# Patient Record
Sex: Male | Born: 1960 | Race: Black or African American | Hispanic: No | Marital: Single
Health system: Southern US, Community
[De-identification: ages and names within clinical notes are randomized; demographics above are authoritative.]

## PROBLEM LIST (undated history)

## (undated) DIAGNOSIS — N186 End stage renal disease: Secondary | ICD-10-CM

## (undated) DIAGNOSIS — K5790 Diverticulosis of intestine, part unspecified, without perforation or abscess without bleeding: Secondary | ICD-10-CM

## (undated) DIAGNOSIS — K219 Gastro-esophageal reflux disease without esophagitis: Secondary | ICD-10-CM

## (undated) DIAGNOSIS — E119 Type 2 diabetes mellitus without complications: Secondary | ICD-10-CM

## (undated) DIAGNOSIS — E213 Hyperparathyroidism, unspecified: Secondary | ICD-10-CM

## (undated) DIAGNOSIS — I10 Essential (primary) hypertension: Secondary | ICD-10-CM

## (undated) DIAGNOSIS — I4892 Unspecified atrial flutter: Secondary | ICD-10-CM

## (undated) HISTORY — PX: TRACHEOSTOMY: SUR1362

## (undated) HISTORY — PX: PARATHYROIDECTOMY: SHX19

## (undated) HISTORY — PX: THYROIDECTOMY, PARTIAL: SHX18

## (undated) HISTORY — PX: COLECTOMY: SHX59

---

## 2014-10-14 ENCOUNTER — Institutional Professional Consult (permissible substitution) (HOSPITAL_COMMUNITY): Payer: Self-pay

## 2014-10-14 ENCOUNTER — Inpatient Hospital Stay
Admission: AD | Admit: 2014-10-14 | Discharge: 2014-12-02 | Disposition: A | Discharge status: Skilled Nursing Facility | Payer: Self-pay | Source: Ambulatory Visit | Attending: Internal Medicine | Admitting: Internal Medicine

## 2014-10-14 DIAGNOSIS — K810 Acute cholecystitis: Secondary | ICD-10-CM | POA: Insufficient documentation

## 2014-10-14 DIAGNOSIS — J9600 Acute respiratory failure, unspecified whether with hypoxia or hypercapnia: Secondary | ICD-10-CM

## 2014-10-14 DIAGNOSIS — I10 Essential (primary) hypertension: Secondary | ICD-10-CM | POA: Diagnosis present

## 2014-10-14 DIAGNOSIS — Z13810 Encounter for screening for upper gastrointestinal disorder: Secondary | ICD-10-CM

## 2014-10-14 DIAGNOSIS — K819 Cholecystitis, unspecified: Secondary | ICD-10-CM

## 2014-10-14 DIAGNOSIS — R140 Abdominal distension (gaseous): Secondary | ICD-10-CM | POA: Insufficient documentation

## 2014-10-14 DIAGNOSIS — E213 Hyperparathyroidism, unspecified: Secondary | ICD-10-CM

## 2014-10-14 DIAGNOSIS — Z930 Tracheostomy status: Secondary | ICD-10-CM

## 2014-10-14 DIAGNOSIS — K7460 Unspecified cirrhosis of liver: Secondary | ICD-10-CM

## 2014-10-14 DIAGNOSIS — N186 End stage renal disease: Secondary | ICD-10-CM

## 2014-10-14 DIAGNOSIS — L905 Scar conditions and fibrosis of skin: Secondary | ICD-10-CM | POA: Diagnosis present

## 2014-10-14 DIAGNOSIS — D649 Anemia, unspecified: Secondary | ICD-10-CM | POA: Diagnosis present

## 2014-10-14 DIAGNOSIS — Z8719 Personal history of other diseases of the digestive system: Secondary | ICD-10-CM

## 2014-10-14 DIAGNOSIS — L24A9 Irritant contact dermatitis due friction or contact with other specified body fluids: Secondary | ICD-10-CM

## 2014-10-14 DIAGNOSIS — Z992 Dependence on renal dialysis: Secondary | ICD-10-CM

## 2014-10-14 DIAGNOSIS — E1110 Type 2 diabetes mellitus with ketoacidosis without coma: Secondary | ICD-10-CM | POA: Diagnosis present

## 2014-10-14 DIAGNOSIS — K5790 Diverticulosis of intestine, part unspecified, without perforation or abscess without bleeding: Secondary | ICD-10-CM | POA: Diagnosis present

## 2014-10-14 DIAGNOSIS — J90 Pleural effusion, not elsewhere classified: Secondary | ICD-10-CM

## 2014-10-14 DIAGNOSIS — T148XXA Other injury of unspecified body region, initial encounter: Secondary | ICD-10-CM

## 2014-10-14 DIAGNOSIS — R451 Restlessness and agitation: Secondary | ICD-10-CM | POA: Diagnosis present

## 2014-10-14 DIAGNOSIS — K219 Gastro-esophageal reflux disease without esophagitis: Secondary | ICD-10-CM | POA: Diagnosis present

## 2014-10-14 DIAGNOSIS — I4892 Unspecified atrial flutter: Secondary | ICD-10-CM

## 2014-10-14 DIAGNOSIS — Z9889 Other specified postprocedural states: Secondary | ICD-10-CM

## 2014-10-14 DIAGNOSIS — J9690 Respiratory failure, unspecified, unspecified whether with hypoxia or hypercapnia: Secondary | ICD-10-CM

## 2014-10-14 DIAGNOSIS — Z720 Tobacco use: Secondary | ICD-10-CM

## 2014-10-14 DIAGNOSIS — K567 Ileus, unspecified: Secondary | ICD-10-CM

## 2014-10-14 DIAGNOSIS — Z4659 Encounter for fitting and adjustment of other gastrointestinal appliance and device: Secondary | ICD-10-CM | POA: Insufficient documentation

## 2014-10-14 DIAGNOSIS — R0603 Acute respiratory distress: Secondary | ICD-10-CM

## 2014-10-14 DIAGNOSIS — R4182 Altered mental status, unspecified: Secondary | ICD-10-CM | POA: Insufficient documentation

## 2014-10-14 DIAGNOSIS — K8020 Calculus of gallbladder without cholecystitis without obstruction: Secondary | ICD-10-CM

## 2014-10-14 HISTORY — DX: End stage renal disease: N18.6

## 2014-10-14 HISTORY — DX: Hyperparathyroidism, unspecified: E21.3

## 2014-10-14 HISTORY — DX: Essential (primary) hypertension: I10

## 2014-10-14 HISTORY — DX: Unspecified atrial flutter: I48.92

## 2014-10-14 HISTORY — DX: Gastro-esophageal reflux disease without esophagitis: K21.9

## 2014-10-14 HISTORY — DX: Type 2 diabetes mellitus without complications: E11.9

## 2014-10-14 HISTORY — DX: Diverticulosis of intestine, part unspecified, without perforation or abscess without bleeding: K57.90

## 2014-10-14 LAB — BLOOD GAS, ARTERIAL
ACID-BASE EXCESS: 3.1 mmol/L — AB (ref 0.0–2.0)
BICARBONATE: 27.7 meq/L — AB (ref 20.0–24.0)
FIO2: 0.4 %
O2 Saturation: 97.6 %
PCO2 ART: 47.2 mmHg — AB (ref 35.0–45.0)
PEEP/CPAP: 5 cmH2O
PH ART: 7.386 (ref 7.350–7.450)
PO2 ART: 98.9 mmHg (ref 80.0–100.0)
Patient temperature: 98.6
Pressure support: 14 cmH2O
TCO2: 29.1 mmol/L (ref 0–100)

## 2014-10-14 NOTE — Consult Note (Signed)
Date: 10/14/2014                  Patient Name:  Harold Mayo  MRN: [id number]  DOB: [birth year]  Age / Sex: 54 y.o., male         PCP: No primary care provider on file.                 Service Requesting Consult: Internal medicine                 Reason for Consult: ESRD            History of Present Illness: Patient is a 54 y.o. male with medical problems of ESRD, who was [facility] 10[facility]14[facility]2014 for evaluation of respiratory failure. He underwent Ex Lap with sigmoid resection and colostomy on 09/10/14. He now has a wound vac. He is not able to provide any meaningful history due to vent dependency and sedation. All information is obtained from the chart. Patient is a chronic dialysis patient. Dialyzes at one of the [facility] centers in [place], [place]. Transferred from [facility]   Medications: Outpatient medications: No prescriptions prior to admission    Current medications: No current facility-administered medications for this encounter.      Allergies: Allergies not on file    Past Medical History: No past medical history on file.   Past Surgical History: No past surgical history on file.   Family History: No family history on file.   Social History: History   Social History  . Marital Status: Single    Spouse Name: N/A  . Number of Children: N/A  . Years of Education: N/A   Occupational History  . Not on file.   Social History Main Topics  . Smoking status: Not on file  . Smokeless tobacco: Not on file  . Alcohol Use: Not on file  . Drug Use: Not on file  . Sexual Activity: Not on file   Other Topics Concern  . Not on file   Social History Narrative  . No narrative on file     Review of Systems: N/A  Vital Signs: There were no vitals taken for this visit.  98  21  94  112/71 Weight trends: There were no vitals filed for this visit.  Physical Exam: General:  critically ill appearing  HEENT Muddy  sclera, NGT in place  Neck:  trach in place  Lungs: Vent assisted, Fio2 40%  Heart: Regular, no rub  Abdomen: Wound vac in place  Extremities:  no significant peripheral edema  Neurologic: sedated  Skin: Left forearm AVF             Lab results: Basic Metabolic Panel: No results for input(s): NA, K, CL, CO2, GLUCOSE, BUN, CREATININE, CALCIUM, MG, PHOS in the last 168 hours.  Liver Function Tests: No results for input(s): AST, ALT, ALKPHOS, BILITOT, PROT, ALBUMIN in the last 168 hours. No results for input(s): LIPASE, AMYLASE in the last 168 hours. No results for input(s): AMMONIA in the last 168 hours.  CBC: No results for input(s): WBC, NEUTROABS, HGB, HCT, MCV, PLT in the last 168 hours.  Cardiac Enzymes: No results for input(s): CKTOTAL, TROPONINI in the last 168 hours.  BNP: Invalid input(s): POCBNP  CBG: No results for input(s): GLUCAP in the last 168 hours.  Microbiology: No results found for this or any previous visit.  Coagulation Studies: No results for input(s): LABPROT, INR in the  last 72 hours.  Urinalysis: No results for input(s): COLORURINE, LABSPEC, PHURINE, GLUCOSEU, HGBUR, BILIRUBINUR, KETONESUR, PROTEINUR, UROBILINOGEN, NITRITE, LEUKOCYTESUR in the last 72 hours.  Invalid input(s): APPERANCEUR   Labs from d/c summary were reviewed K 3.9 on 10/14/14  Imaging:  No results found.   Assessment & Plan: Pt is a [age] y.o. yo male with a PMHX of ESRD, HTN, CHF, Previous alcohol abuse, tobacco abse, , [facility] with resp failure.  He is s/p Ex Lap, sigmoid colectomy and colostomy placement. He has a wound vac  1. ESRD - Electrolytes and Volume status are acceptable No acute indication for Dialysis at present  - WIll place orders for dialysis for Friday 2. AOCKD - last Hgb 8.7 today - will start Aranesp 3. SHPTH - will monitor Phos level 4. Screen Hep B Ag

## 2014-10-15 ENCOUNTER — Other Ambulatory Visit (HOSPITAL_COMMUNITY): Payer: Self-pay

## 2014-10-15 ENCOUNTER — Encounter: Payer: Self-pay | Admitting: Pulmonary Disease

## 2014-10-15 DIAGNOSIS — Z930 Tracheostomy status: Secondary | ICD-10-CM

## 2014-10-15 DIAGNOSIS — Z992 Dependence on renal dialysis: Secondary | ICD-10-CM

## 2014-10-15 DIAGNOSIS — N186 End stage renal disease: Secondary | ICD-10-CM

## 2014-10-15 DIAGNOSIS — I10 Essential (primary) hypertension: Secondary | ICD-10-CM

## 2014-10-15 DIAGNOSIS — Z720 Tobacco use: Secondary | ICD-10-CM

## 2014-10-15 DIAGNOSIS — E213 Hyperparathyroidism, unspecified: Secondary | ICD-10-CM

## 2014-10-15 DIAGNOSIS — D649 Anemia, unspecified: Secondary | ICD-10-CM | POA: Diagnosis present

## 2014-10-15 DIAGNOSIS — J9600 Acute respiratory failure, unspecified whether with hypoxia or hypercapnia: Secondary | ICD-10-CM

## 2014-10-15 DIAGNOSIS — R451 Restlessness and agitation: Secondary | ICD-10-CM

## 2014-10-15 DIAGNOSIS — K5790 Diverticulosis of intestine, part unspecified, without perforation or abscess without bleeding: Secondary | ICD-10-CM | POA: Diagnosis present

## 2014-10-15 DIAGNOSIS — Z9889 Other specified postprocedural states: Secondary | ICD-10-CM

## 2014-10-15 DIAGNOSIS — J9601 Acute respiratory failure with hypoxia: Secondary | ICD-10-CM

## 2014-10-15 DIAGNOSIS — I4892 Unspecified atrial flutter: Secondary | ICD-10-CM

## 2014-10-15 DIAGNOSIS — L905 Scar conditions and fibrosis of skin: Secondary | ICD-10-CM | POA: Diagnosis present

## 2014-10-15 DIAGNOSIS — E1110 Type 2 diabetes mellitus with ketoacidosis without coma: Secondary | ICD-10-CM | POA: Diagnosis present

## 2014-10-15 DIAGNOSIS — K219 Gastro-esophageal reflux disease without esophagitis: Secondary | ICD-10-CM | POA: Diagnosis present

## 2014-10-15 LAB — COMPREHENSIVE METABOLIC PANEL
ALBUMIN: 3.3 g/dL — AB (ref 3.5–5.0)
ALT: 13 U/L — ABNORMAL LOW (ref 17–63)
ANION GAP: 18 — AB (ref 5–15)
AST: 32 U/L (ref 15–41)
Alkaline Phosphatase: 190 U/L — ABNORMAL HIGH (ref 38–126)
BUN: 37 mg/dL — ABNORMAL HIGH (ref 6–20)
CALCIUM: 10 mg/dL (ref 8.9–10.3)
CHLORIDE: 88 mmol/L — AB (ref 101–111)
CO2: 24 mmol/L (ref 22–32)
CREATININE: 6.18 mg/dL — AB (ref 0.61–1.24)
GFR calc Af Amer: 11 mL/min — ABNORMAL LOW (ref >60)
GFR calc non Af Amer: 9 mL/min — ABNORMAL LOW (ref >60)
GLUCOSE: 80 mg/dL (ref 65–99)
Potassium: 4.4 mmol/L (ref 3.5–5.1)
Sodium: 130 mmol/L — ABNORMAL LOW (ref 135–145)
Total Bilirubin: 1.8 mg/dL — ABNORMAL HIGH (ref 0.3–1.2)
Total Protein: 7.9 g/dL (ref 6.5–8.1)

## 2014-10-15 LAB — CBC
HCT: 32.6 % — ABNORMAL LOW (ref 39.0–52.0)
Hemoglobin: 10.2 g/dL — ABNORMAL LOW (ref 13.0–17.0)
MCH: 28.2 pg (ref 26.0–34.0)
MCHC: 31.3 g/dL (ref 30.0–36.0)
MCV: 90.1 fL (ref 78.0–100.0)
PLATELETS: 230 10*3/uL (ref 150–400)
RBC: 3.62 MIL/uL — AB (ref 4.22–5.81)
RDW: 16.8 % — ABNORMAL HIGH (ref 11.5–15.5)
WBC: 6.2 10*3/uL (ref 4.0–10.5)

## 2014-10-15 LAB — MAGNESIUM: MAGNESIUM: 2.5 mg/dL — AB (ref 1.7–2.4)

## 2014-10-15 LAB — PHOSPHORUS: Phosphorus: 5.9 mg/dL — ABNORMAL HIGH (ref 2.5–4.6)

## 2014-10-15 LAB — TROPONIN I: TROPONIN I: 0.51 ng/mL — AB (ref <0.031)

## 2014-10-15 NOTE — Significant Event (Cosign Needed)
Ultrasound views of left and right pleural effusions. Left recumbent     Right recumbent effusion     [name] Tynisa Vohs ACNP [name] PCCM Pager [phone] till 3 pm If no answer page [phone] 10/15/2014, 10:40 AM

## 2014-10-15 NOTE — Consult Note (Signed)
Name: Harold Mayo MRN: [id number] DOB: [birth year]    ADMISSION DATE:  10/14/2014 CONSULTATION DATE: 10/15/2014  REFERRING MD :  [facility]  CHIEF COMPLAINT:  Resp failure  BRIEF PATIENT DESCRIPTION:  54 yo male admitted to [facility] 09/10/14 with abdominal pain.  Had laparotomy, colon resection and abscess drainage.  Developed respiratory failure with failure to wean from vent and required tracheostomy 6/01.  Transferred to [facility] 6/08 for wound care, rehab, and vent weaning.  SIGNIFICANT EVENTS   STUDIES:   HISTORY OF PRESENT ILLNESS:  54 yo AAM with known ESRD(HD T TH Sat) diverticula dz,GERD, HTN,A flutter, tobacco abuse who presented to [facility] 5/5 with cc of abd pain. He was admitted, underwent lap with colon resection , abscess drainage,complicated by resp failure , sepsis and electrolyte imbalance. Trached 6/1 and transferred to [facility] 6/8. PCCM consulted 6/9.  PAST MEDICAL HISTORY :   has a past medical history of ESRD (end stage renal disease); GERD (gastroesophageal reflux disease); Hypertension; Atrial flutter; Diverticulosis; Diabetes mellitus, type 2; and Hyperparathyroidism.   PAST SURGICAL HISTORY:  has past surgical history that includes Colectomy; Tracheostomy; Parathyroidectomy; and Thyroidectomy, partial.  MEDICATIONS/ALLERGIES: Reviewed in bedside chart  FAMILY HISTORY:  Unable to obtain.  SOCIAL HISTORY: Unable to obtain.  REVIEW OF SYSTEMS:   nasmoker  SUBJECTIVE:  Sedated  VITAL SIGNS: 98.7 105 105/58 rr 19 97%   PHYSICAL EXAMINATION: General:  WNWDAAM, sedated on vent with diprivan at 35 mcg Neuro:  Sedated rass-2 HEENT: Tracheostomy, ngt with tube feeds Cardiovascular: HSR RRR Lungs:  Decreased bs bases, no wheeze Ps 12, peep 5 rr 18 Svt 700 cc, 35% fio2 Abdomen:  Open wound with mid line vac, colostomy Musculoskeletal:  intact Skin: warm and dry   CMP Latest Ref Rng 10/15/2014  Glucose 65 - 99 mg/dL 80  BUN 6 - 20  mg/dL 64(P)  Creatinine 3.29 - 1.24 mg/dL 5.18(A)  Sodium 416 - 606 mmol/L 130(L)  Potassium 3.5 - 5.1 mmol/L 4.4  Chloride 101 - 111 mmol/L 88(L)  CO2 22 - 32 mmol/L 24  Calcium 8.9 - 10.3 mg/dL 30.1  Total Protein 6.5 - 8.1 g/dL 7.9  Total Bilirubin 0.3 - 1.2 mg/dL 6.0(F)  Alkaline Phos 38 - 126 U/L 190(H)  AST 15 - 41 U/L 32  ALT 17 - 63 U/L 13(L)    CBC Latest Ref Rng 10/15/2014  WBC 4.0 - 10.5 K/uL 6.2  Hemoglobin 13.0 - 17.0 g/dL 10.2(L)  Hematocrit 39.0 - 52.0 % 32.6(L)  Platelets 150 - 400 K/uL 230    ABG    Component Value Date/Time   PHART 7.386 10/14/2014 1530   PCO2ART 47.2* 10/14/2014 1530   PO2ART 98.9 10/14/2014 1530   HCO3 27.7* 10/14/2014 1530   TCO2 29.1 10/14/2014 1530   O2SAT 97.6 10/14/2014 1530    Dg Chest Port 1 View  10/15/2014   CLINICAL DATA:  Respiratory failure .  EXAM: PORTABLE CHEST - 1 VIEW  COMPARISON:  None.  FINDINGS: Tracheostomy tube and NG tube in good anatomic position. Right PICC line noted with tip at cavoatrial junction. Cardiomegaly. Bilateral pulmonary alveolar infiltrates and pleural effusions. These findings are most likely secondary to congestive heart failure with pulmonary edema. Bilateral pneumonia cannot be excluded. No pneumothorax.  IMPRESSION: 1. Tracheostomy tube, NG tube, right PICC line in good anatomic position. 2. Congestive heart failure bilateral pulmonary edema and bilateral effusions. Associated pneumonia cannot be excluded.   Electronically Signed   By: Maisie Fus  Register   On: 10/15/2014 07:07   Dg Abd Portable 1v  10/14/2014   CLINICAL DATA:  Nasogastric tube placement.  Initial encounter.  EXAM: PORTABLE ABDOMEN - 1 VIEW  COMPARISON:  None.  FINDINGS: Enteric tube is present with the tip in the gastric fundus. Calcifications project over the renal shadows bilaterally, compatible with collecting system calculi. Visible bowel gas pattern appears normal.  IMPRESSION: Enteric tube tip in the mid gastric fundus.    Electronically Signed   By: Andreas Newport M.D.   On: 10/14/2014 15:37    ASSESSMENT   Acute respiratory failure after laparotomy. Failure to wean from ventilatory s/p tracheostomy. Hx of tobacco abuse. Plan: - pressure support wean as tolerated - f/u CXR - limit sedation - assess Lt pleural space with u/s >> might need CT chest to further assess - negative fluid balance as tolerated  Sigmoid colectomy, colostomy, Hartmann pouch with open wound and wound vac. Plan: - wound care, nutrition per primary team  Hx of ESRD, DM type II, HTN, GERD, A flutter. S/p partial thyroidectomy, parathyroidectomy 10/07/14. Plan: - per primary team   [name] Minor ACNP [name] PCCM Pager [phone] till 3 pm If no answer page [phone] 10/15/2014, 10:27 AM  Reviewed above, examined pt.  He had laparotomy for abscess and diverticular disease.  He had sigmoid colectomy.  He had respiratory failure and required trach due to difficulty with vent weaning.  His RASS is -3, and he is on diprivan.  Trach site clean, pupil reactive, heart rate regular, Lt > Rt crackles, wound vac in place, colostomy in place, Lt arm AV graft.  Labs, CXR reviewed.  Will try to limit sedation.  Check Lt pleural space with bedside u/s >> might need CT chest to further assess.  HD per renal.  Wean vent as tolerated.  Coralyn Helling, MD [facility] Pulmonary/Critical Care 10/15/2014, 10:31 AM Pager:  [phone] After 3pm call: [phone]

## 2014-10-15 NOTE — Consult Note (Signed)
CONSULTATION NOTE  Reason for Consult: RBBB, prolonged QTC, Atrial flutter  Requesting Physician: Dr. Ardis Hughs  Cardiologist: Unknown  HPI: This is a 54 y.o. male with a past medical history significant for ESRD on HD (T,Th,Sa), acute respiratory failure with tracheostomy, HTN, atrial flutter, diverticulosis with intestinal perforation and abscess, recent septic shock, and congestive heart failure.  He was recently admitted to [facility] in [place] with 2 weeks of abdominal pain, early satiety, nausea and vomiting. He was found to have pneumoperitoneum and underwent diagnostic laparoscopy and had sigmoid resection and Hartman's colostomy. Post-operative coarse was extensive and he was difficult to wean from the ventilator. He had transection of the thyroid isthmus and underwent a planned tracheostomy. He was noted to have atrial fibrillation/flutter during the hospitalization. EKG shows a wide RBBB. Troponin was noted to be elevated and continues to be elevated here. Cardiology is asked to consult regarding management.  PMHx:  Past Medical History  Diagnosis Date  . ESRD (end stage renal disease)   . GERD (gastroesophageal reflux disease)   . Hypertension   . Atrial flutter   . Diverticulosis   . Diabetes mellitus, type 2   . Hyperparathyroidism    Past Surgical History  Procedure Laterality Date  . Colectomy    . Tracheostomy    . Parathyroidectomy    . Thyroidectomy, partial      FAMHx: No family history on file.  Unable to obtain due to patient factors.  SOCHx:  has no tobacco, alcohol, and drug history on file.  ALLERGIES: Allergies not on file  ROS: Review of systems not obtained due to patient factors.  HOME MEDICATIONS: No prescriptions prior to admission    HOSPITAL MEDICATIONS: I have reviewed the patient's current medications.  VITALS: There were no vitals taken for this visit.  PHYSICAL EXAM: General appearance: alert and trached on vent,  not following commands, appears agitated Neck: JVD - 3 cm above sternal notch and no carotid bruit Lungs: diminished breath sounds bibasilar Heart: regular tachycardia Abdomen: protuberant, colostomy in place Extremities: extremities normal, atraumatic, no cyanosis or edema Pulses: 2+ and symmetric Skin: Skin color, texture, turgor normal. No rashes or lesions Neurologic: Mental status: Alert, does not follow commands, agitated Psych: Cannot assess  LABS: Results for orders placed or performed during the hospital encounter of 10/14/14 (from the past 48 hour(s))  Blood gas, arterial     Status: Abnormal   Collection Time: 10/14/14  3:30 PM  Result Value Ref Range   FIO2 0.40 %   Delivery systems VENTILATOR    Mode PRESSURE SUPPORT    Peep/cpap 5.0 cm H20   Pressure support 14.0 cm H20   pH, Arterial 7.386 7.350 - 7.450   pCO2 arterial 47.2 (H) 35.0 - 45.0 mmHg   pO2, Arterial 98.9 80.0 - 100.0 mmHg   Bicarbonate 27.7 (H) 20.0 - 24.0 mEq/L   TCO2 29.1 0 - 100 mmol/L   Acid-Base Excess 3.1 (H) 0.0 - 2.0 mmol/L   O2 Saturation 97.6 %   Patient temperature 98.6    Collection site RIGHT RADIAL    Drawn by COLLECTED BY RT    Sample type ARTERIAL DRAW    Allens test (pass/fail) PASS PASS  Comprehensive metabolic panel     Status: Abnormal   Collection Time: 10/15/14  6:13 AM  Result Value Ref Range   Sodium 130 (L) 135 - 145 mmol/L   Potassium 4.4 3.5 - 5.1 mmol/L   Chloride 88 (L) 101 -  111 mmol/L   CO2 24 22 - 32 mmol/L   Glucose, Bld 80 65 - 99 mg/dL   BUN 37 (H) 6 - 20 mg/dL   Creatinine, Ser 6.18 (H) 0.61 - 1.24 mg/dL   Calcium 10.0 8.9 - 10.3 mg/dL   Total Protein 7.9 6.5 - 8.1 g/dL   Albumin 3.3 (L) 3.5 - 5.0 g/dL   AST 32 15 - 41 U/L   ALT 13 (L) 17 - 63 U/L   Alkaline Phosphatase 190 (H) 38 - 126 U/L   Total Bilirubin 1.8 (H) 0.3 - 1.2 mg/dL   GFR calc non Af Amer 9 (L) >60 mL/min   GFR calc Af Amer 11 (L) >60 mL/min    Comment: (NOTE) The eGFR has been  calculated using the CKD EPI equation. This calculation has not been validated in all clinical situations. eGFR's persistently <60 mL/min signify possible Chronic Kidney Disease.    Anion gap 18 (H) 5 - 15  CBC     Status: Abnormal   Collection Time: 10/15/14  6:13 AM  Result Value Ref Range   WBC 6.2 4.0 - 10.5 K/uL    Comment: WHITE COUNT CONFIRMED ON SMEAR   RBC 3.62 (L) 4.22 - 5.81 MIL/uL   Hemoglobin 10.2 (L) 13.0 - 17.0 g/dL   HCT 32.6 (L) 39.0 - 52.0 %   MCV 90.1 78.0 - 100.0 fL   MCH 28.2 26.0 - 34.0 pg   MCHC 31.3 30.0 - 36.0 g/dL   RDW 16.8 (H) 11.5 - 15.5 %   Platelets 230 150 - 400 K/uL  Magnesium     Status: Abnormal   Collection Time: 10/15/14  6:13 AM  Result Value Ref Range   Magnesium 2.5 (H) 1.7 - 2.4 mg/dL  Phosphorus     Status: Abnormal   Collection Time: 10/15/14  6:13 AM  Result Value Ref Range   Phosphorus 5.9 (H) 2.5 - 4.6 mg/dL  Troponin I     Status: Abnormal   Collection Time: 10/15/14  3:19 PM  Result Value Ref Range   Troponin I 0.51 (HH) <0.031 ng/mL    Comment:        POSSIBLE MYOCARDIAL ISCHEMIA. SERIAL TESTING RECOMMENDED. REPEATED TO VERIFY CRITICAL RESULT CALLED TO, READ BACK BY AND VERIFIED WITH: L CLIFTON,RN 1620 10/15/14 D BRADLEY     IMAGING: Dg Chest Port 1 View  10/15/2014   CLINICAL DATA:  Respiratory failure .  EXAM: PORTABLE CHEST - 1 VIEW  COMPARISON:  None.  FINDINGS: Tracheostomy tube and NG tube in good anatomic position. Right PICC line noted with tip at cavoatrial junction. Cardiomegaly. Bilateral pulmonary alveolar infiltrates and pleural effusions. These findings are most likely secondary to congestive heart failure with pulmonary edema. Bilateral pneumonia cannot be excluded. No pneumothorax.  IMPRESSION: 1. Tracheostomy tube, NG tube, right PICC line in good anatomic position. 2. Congestive heart failure bilateral pulmonary edema and bilateral effusions. Associated pneumonia cannot be excluded.   Electronically Signed    By: Marcello Moores  Register   On: 10/15/2014 07:07   Dg Abd Portable 1v  10/15/2014   CLINICAL DATA:  NG tube placement.  EXAM: PORTABLE ABDOMEN - 1 VIEW  COMPARISON:  10/14/2014  FINDINGS: NG tube tip is in the body of the stomach. Bowel gas pattern is normal. Ostomy in the left lower quadrant. No acute osseous abnormality. Cardiomegaly. Left pleural effusion.  IMPRESSION: NG tube tip in the body of the stomach.   Electronically Signed   By: Jeneen Rinks  Maxwell M.D.   On: 10/15/2014 10:36   Dg Abd Portable 1v  10/14/2014   CLINICAL DATA:  Nasogastric tube placement.  Initial encounter.  EXAM: PORTABLE ABDOMEN - 1 VIEW  COMPARISON:  None.  FINDINGS: Enteric tube is present with the tip in the gastric fundus. Calcifications project over the renal shadows bilaterally, compatible with collecting system calculi. Visible bowel gas pattern appears normal.  IMPRESSION: Enteric tube tip in the mid gastric fundus.   Electronically Signed   By: Dereck Ligas M.D.   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 6/1 transection of isthmus of tyroid   DM (diabetes mellitus) type 2   ESRD on dialysis left forearm av shunt   Anemia   HTN (hypertension)   Diverticular disease   GERD (gastroesophageal reflux disease)   Agitation requiring sedation protocol, on diprivan   Hyperparathyroidism, surgical intervention 6/1   Atrial flutter   IMPRESSION: 1. Atrial flutter with RVR (CHADSVASC 4) 2. Elevated troponin 3. RBBB with prolonged QTc 4. Agitation, delerium  RECOMMENDATION: 1. Harold Mayo has a reported history of CHF and is now in persistent atrial flutter with RVR. He will need better rate control. I agree with IV cardizem and lopressor as needed for BP control. He was on daily  lovenox at a DVT prophylaxis dose. As long as there is no contraindication to heparin, I would recommend therapeutic heparin for elevated troponin as well as stroke risk. At some point, we may recommend a cardioversion attempt to try and restore sinus rhythm. He will need a 2D echo to evaluate LV Function. Ultimately, if he has not had a coronary evaluation, stress testing or cardiac catheterization may be indicated.  This is dependent on his pulmonary recovery. Finally, he does have prolonged QTc, which may be medication-related and is in part due to RBBB. I agree with weaning off seroquel. Consider low dose haldol for acute management, which may be somewhat safer.  Thanks for consulting [place].  Time Spent Directly with Patient: 45 minutes  Pixie Casino, MD, [facility] Attending Cardiologist [place] 10/15/2014, 5:47 PM

## 2014-10-16 ENCOUNTER — Other Ambulatory Visit (HOSPITAL_COMMUNITY): Payer: Self-pay

## 2014-10-16 ENCOUNTER — Institutional Professional Consult (permissible substitution) (HOSPITAL_COMMUNITY): Payer: Self-pay

## 2014-10-16 DIAGNOSIS — I4892 Unspecified atrial flutter: Secondary | ICD-10-CM

## 2014-10-16 LAB — BLOOD GAS, ARTERIAL
Acid-base deficit: 0.6 mmol/L (ref 0.0–2.0)
Bicarbonate: 24.3 mEq/L — ABNORMAL HIGH (ref 20.0–24.0)
FIO2: 0.35 %
MECHVT: 500 mL
O2 SAT: 95.1 %
PATIENT TEMPERATURE: 98.6
PEEP/CPAP: 5 cmH2O
RATE: 12 resp/min
TCO2: 25.7 mmol/L (ref 0–100)
pCO2 arterial: 45.7 mmHg — ABNORMAL HIGH (ref 35.0–45.0)
pH, Arterial: 7.345 — ABNORMAL LOW (ref 7.350–7.450)
pO2, Arterial: 84.1 mmHg (ref 80.0–100.0)

## 2014-10-16 LAB — CBC
HEMATOCRIT: 29.1 % — AB (ref 39.0–52.0)
Hemoglobin: 9.1 g/dL — ABNORMAL LOW (ref 13.0–17.0)
MCH: 27.6 pg (ref 26.0–34.0)
MCHC: 31.3 g/dL (ref 30.0–36.0)
MCV: 88.2 fL (ref 78.0–100.0)
PLATELETS: 215 10*3/uL (ref 150–400)
RBC: 3.3 MIL/uL — ABNORMAL LOW (ref 4.22–5.81)
RDW: 16.4 % — ABNORMAL HIGH (ref 11.5–15.5)
WBC: 6.4 10*3/uL (ref 4.0–10.5)

## 2014-10-16 LAB — RENAL FUNCTION PANEL
ALBUMIN: 3.2 g/dL — AB (ref 3.5–5.0)
Anion gap: 19 — ABNORMAL HIGH (ref 5–15)
BUN: 50 mg/dL — ABNORMAL HIGH (ref 6–20)
CALCIUM: 9.7 mg/dL (ref 8.9–10.3)
CO2: 23 mmol/L (ref 22–32)
Chloride: 87 mmol/L — ABNORMAL LOW (ref 101–111)
Creatinine, Ser: 7.51 mg/dL — ABNORMAL HIGH (ref 0.61–1.24)
GFR calc non Af Amer: 7 mL/min — ABNORMAL LOW (ref >60)
GFR, EST AFRICAN AMERICAN: 9 mL/min — AB (ref >60)
Glucose, Bld: 122 mg/dL — ABNORMAL HIGH (ref 65–99)
POTASSIUM: 4.8 mmol/L (ref 3.5–5.1)
Phosphorus: 6.1 mg/dL — ABNORMAL HIGH (ref 2.5–4.6)
SODIUM: 129 mmol/L — AB (ref 135–145)

## 2014-10-16 LAB — HEPARIN LEVEL (UNFRACTIONATED)

## 2014-10-16 LAB — T4, FREE: FREE T4: 0.65 ng/dL (ref 0.61–1.12)

## 2014-10-16 LAB — PROTIME-INR
INR: 1.3 (ref 0.00–1.49)
Prothrombin Time: 16.3 seconds — ABNORMAL HIGH (ref 11.6–15.2)

## 2014-10-16 LAB — AMMONIA: Ammonia: 47 umol/L — ABNORMAL HIGH (ref 9–35)

## 2014-10-16 LAB — TSH: TSH: 1.508 u[IU]/mL (ref 0.350–4.500)

## 2014-10-16 LAB — APTT: aPTT: 47 seconds — ABNORMAL HIGH (ref 24–37)

## 2014-10-16 MED ORDER — IOHEXOL 300 MG/ML  SOLN
100.0000 mL | Freq: Once | INTRAMUSCULAR | Status: AC | PRN
  Administered 2014-10-16: 100 mL via INTRAVENOUS

## 2014-10-16 NOTE — Progress Notes (Addendum)
SUBJECTIVE:  Intubated  OBJECTIVE:   Vitals:  There were no vitals filed for this visit. I&O's:  No intake or output data in the 24 hours ending 10/16/14 1019 TELEMETRY: Reviewed telemetry pt appears in sinus tach:   PHYSICAL EXAM General: Well developed, well nourished, in no acute distress Head:   Normal cephalic and atramatic  Lungs:   Coarse BS bilaterally to auscultation. Heart:  Tachycardic, regularS1 S2  No JVD.   Abdomen: abdomen soft and non-distended    Neuro: Intubated Psych:  Intubated Skin: No rash   LABS: Basic Metabolic Panel:  Recent Labs  88/91/69 0613 10/16/14 0500  NA 130* 129*  K 4.4 4.8  CL 88* 87*  CO2 24 23  GLUCOSE 80 122*  BUN 37* 50*  CREATININE 6.18* 7.51*  CALCIUM 10.0 9.7  MG 2.5*  --   PHOS 5.9* 6.1*   Liver Function Tests:  Recent Labs  10/15/14 0613 10/16/14 0500  AST 32  --   ALT 13*  --   ALKPHOS 190*  --   BILITOT 1.8*  --   PROT 7.9  --   ALBUMIN 3.3* 3.2*   No results for input(s): LIPASE, AMYLASE in the last 72 hours. CBC:  Recent Labs  10/15/14 0613 10/16/14 0500  WBC 6.2 6.4  HGB 10.2* 9.1*  HCT 32.6* 29.1*  MCV 90.1 88.2  PLT 230 215   Cardiac Enzymes:  Recent Labs  10/15/14 1519  TROPONINI 0.51*   BNP: Invalid input(s): POCBNP D-Dimer: No results for input(s): DDIMER in the last 72 hours. Hemoglobin A1C: No results for input(s): HGBA1C in the last 72 hours. Fasting Lipid Panel: No results for input(s): CHOL, HDL, LDLCALC, TRIG, CHOLHDL, LDLDIRECT in the last 72 hours. Thyroid Function Tests: No results for input(s): TSH, T4TOTAL, T3FREE, THYROIDAB in the last 72 hours.  Invalid input(s): FREET3 Anemia Panel: No results for input(s): VITAMINB12, FOLATE, FERRITIN, TIBC, IRON, RETICCTPCT in the last 72 hours. Coag Panel:   Lab Results  Component Value Date   INR 1.30 10/16/2014    RADIOLOGY: Dg Chest Port 1 View  10/16/2014   CLINICAL DATA:  Respiratory failure  EXAM: PORTABLE CHEST  - 1 VIEW  COMPARISON:  10/15/2014  FINDINGS: There is a tracheostomy. Nasogastric tube extends into the stomach. Right upper extremity PICC line extends into the SVC.  There is a left pleural effusion. There is no interval change in the central and basilar airspace opacities bilaterally.  IMPRESSION: Support equipment appears satisfactorily positioned.  No significant interval change in the bilateral airspace opacities.  Persistent left pleural effusion   Electronically Signed   By: Ellery Plunk M.D.   On: 10/16/2014 05:57   Dg Chest Port 1 View  10/15/2014   CLINICAL DATA:  Respiratory failure .  EXAM: PORTABLE CHEST - 1 VIEW  COMPARISON:  None.  FINDINGS: Tracheostomy tube and NG tube in good anatomic position. Right PICC line noted with tip at cavoatrial junction. Cardiomegaly. Bilateral pulmonary alveolar infiltrates and pleural effusions. These findings are most likely secondary to congestive heart failure with pulmonary edema. Bilateral pneumonia cannot be excluded. No pneumothorax.  IMPRESSION: 1. Tracheostomy tube, NG tube, right PICC line in good anatomic position. 2. Congestive heart failure bilateral pulmonary edema and bilateral effusions. Associated pneumonia cannot be excluded.   Electronically Signed   By: Maisie Fus  Register   On: 10/15/2014 07:07   Dg Abd Portable 1v  10/15/2014   CLINICAL DATA:  NG tube placement.  EXAM: PORTABLE  ABDOMEN - 1 VIEW  COMPARISON:  10/14/2014  FINDINGS: NG tube tip is in the body of the stomach. Bowel gas pattern is normal. Ostomy in the left lower quadrant. No acute osseous abnormality. Cardiomegaly. Left pleural effusion.  IMPRESSION: NG tube tip in the body of the stomach.   Electronically Signed   By: Francene Boyers M.D.   On: 10/15/2014 10:36   Dg Abd Portable 1v  10/14/2014   CLINICAL DATA:  Nasogastric tube placement.  Initial encounter.  EXAM: PORTABLE ABDOMEN - 1 VIEW  COMPARISON:  None.  FINDINGS: Enteric tube is present with the tip in the gastric  fundus. Calcifications project over the renal shadows bilaterally, compatible with collecting system calculi. Visible bowel gas pattern appears normal.  IMPRESSION: Enteric tube tip in the mid gastric fundus.   Electronically Signed   By: Andreas Newport M.D.   On: 10/14/2014 15:37      ASSESSMENT /PLAN:    1) Atrial flutter: Looks like he is in sinus tach at this time.  Off of Diltiazem drip.  HR in the 80s when he is sleeping.  HR 107 currently.  2) HTN: Meds to be adjusted by primary team.    3) Agree with DrRennis Golden that he would have to make improvements from a pulmonary standpoint before we would think about further cardiac w/u.  Corky Crafts, MD  10/16/2014  10:19 AM

## 2014-10-16 NOTE — Progress Notes (Signed)
Subjective:  Patient is critically ill today Tachycardic, tachypneic Abdomen appears to be more distended    Objective:  Vital signs in last 24 hours:   98.3  162/116  115, A fib with RVR, 93%  Weight change:  There were no vitals filed for this visit.  Intake/Output:       Physical Exam: General: Appears unconfortable  HEENT Moist oral mucus membranes  Neck Trach in place  Pulm/lungs Vent assisted/ Fio2 35%  CVS/Heart Tachycardic, irregular  Abdomen:  Distended, decreased BS, wound vac in place, colosotomy in place  Extremities: + pitting edema  Neurologic: agitated  Skin: No acute rashes  Access: Left radiocephalic AVF       Basic Metabolic Panel:  Recent Labs Lab 10/15/14 0613 10/16/14 0500  NA 130* 129*  K 4.4 4.8  CL 88* 87*  CO2 24 23  GLUCOSE 80 122*  BUN 37* 50*  CREATININE 6.18* 7.51*  CALCIUM 10.0 9.7  MG 2.5*  --   PHOS 5.9* 6.1*     CBC:  Recent Labs Lab 10/15/14 0613 10/16/14 0500  WBC 6.2 6.4  HGB 10.2* 9.1*  HCT 32.6* 29.1*  MCV 90.1 88.2  PLT 230 215      Microbiology: No results found for this or any previous visit.  Coagulation Studies:  Recent Labs  10/16/14 0500  LABPROT 16.3*  INR 1.30    Urinalysis: No results for input(s): COLORURINE, LABSPEC, PHURINE, GLUCOSEU, HGBUR, BILIRUBINUR, KETONESUR, PROTEINUR, UROBILINOGEN, NITRITE, LEUKOCYTESUR in the last 72 hours.  Invalid input(s): APPERANCEUR    Imaging: Dg Chest Port 1 View  10/16/2014   CLINICAL DATA:  Respiratory failure  EXAM: PORTABLE CHEST - 1 VIEW  COMPARISON:  10/15/2014  FINDINGS: There is a tracheostomy. Nasogastric tube extends into the stomach. Right upper extremity PICC line extends into the SVC.  There is a left pleural effusion. There is no interval change in the central and basilar airspace opacities bilaterally.  IMPRESSION: Support equipment appears satisfactorily positioned.  No significant interval change in the bilateral airspace  opacities.  Persistent left pleural effusion   Electronically Signed   By: Ellery Plunk M.D.   On: 10/16/2014 05:57   Dg Chest Port 1 View  10/15/2014   CLINICAL DATA:  Respiratory failure .  EXAM: PORTABLE CHEST - 1 VIEW  COMPARISON:  None.  FINDINGS: Tracheostomy tube and NG tube in good anatomic position. Right PICC line noted with tip at cavoatrial junction. Cardiomegaly. Bilateral pulmonary alveolar infiltrates and pleural effusions. These findings are most likely secondary to congestive heart failure with pulmonary edema. Bilateral pneumonia cannot be excluded. No pneumothorax.  IMPRESSION: 1. Tracheostomy tube, NG tube, right PICC line in good anatomic position. 2. Congestive heart failure bilateral pulmonary edema and bilateral effusions. Associated pneumonia cannot be excluded.   Electronically Signed   By: Maisie Fus  Register   On: 10/15/2014 07:07   Dg Abd Portable 1v  10/15/2014   CLINICAL DATA:  NG tube placement.  EXAM: PORTABLE ABDOMEN - 1 VIEW  COMPARISON:  10/14/2014  FINDINGS: NG tube tip is in the body of the stomach. Bowel gas pattern is normal. Ostomy in the left lower quadrant. No acute osseous abnormality. Cardiomegaly. Left pleural effusion.  IMPRESSION: NG tube tip in the body of the stomach.   Electronically Signed   By: Francene Boyers M.D.   On: 10/15/2014 10:36   Dg Abd Portable 1v  10/14/2014   CLINICAL DATA:  Nasogastric tube placement.  Initial encounter.  EXAM:  PORTABLE ABDOMEN - 1 VIEW  COMPARISON:  None.  FINDINGS: Enteric tube is present with the tip in the gastric fundus. Calcifications project over the renal shadows bilaterally, compatible with collecting system calculi. Visible bowel gas pattern appears normal.  IMPRESSION: Enteric tube tip in the mid gastric fundus.   Electronically Signed   By: Andreas Newport M.D.   On: 10/14/2014 15:37     Medications:       Assessment/ Plan:  54 y.o. male  with  ESRD, HTN, CHF, Previous alcohol abuse, tobacco abse, , was  ad[facility] to [facility] from [facility] on 10/14/2014 with resp failure. He is s/p Ex Lap, sigmoid colectomy and colostomy placement. He has a wound vac. Regular dialysis at [facility] center in [place], [place]  1. 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 SHPTH - will monitor Phos level - avoid binders until abdominal situation is resolved  4. Abdominal distention  - CT of abdomen is planned  5. A fib with RVR    LOS:  Garwood Wentzell 6/10/20169:26 AM

## 2014-10-16 NOTE — Progress Notes (Signed)
  Echocardiogram 2D Echocardiogram has been performed.  Arvil Chaco 10/16/2014, 10:54 AM

## 2014-10-17 DIAGNOSIS — I429 Cardiomyopathy, unspecified: Secondary | ICD-10-CM

## 2014-10-17 DIAGNOSIS — I484 Atypical atrial flutter: Secondary | ICD-10-CM

## 2014-10-17 LAB — CBC
HCT: 29.3 % — ABNORMAL LOW (ref 39.0–52.0)
HEMOGLOBIN: 9.1 g/dL — AB (ref 13.0–17.0)
MCH: 27.2 pg (ref 26.0–34.0)
MCHC: 31.1 g/dL (ref 30.0–36.0)
MCV: 87.7 fL (ref 78.0–100.0)
Platelets: 261 10*3/uL (ref 150–400)
RBC: 3.34 MIL/uL — ABNORMAL LOW (ref 4.22–5.81)
RDW: 16.7 % — ABNORMAL HIGH (ref 11.5–15.5)
WBC: 7.4 10*3/uL (ref 4.0–10.5)

## 2014-10-17 LAB — BASIC METABOLIC PANEL
ANION GAP: 16 — AB (ref 5–15)
BUN: 27 mg/dL — ABNORMAL HIGH (ref 6–20)
CO2: 27 mmol/L (ref 22–32)
CREATININE: 5.21 mg/dL — AB (ref 0.61–1.24)
Calcium: 10.1 mg/dL (ref 8.9–10.3)
Chloride: 94 mmol/L — ABNORMAL LOW (ref 101–111)
GFR calc Af Amer: 13 mL/min — ABNORMAL LOW (ref >60)
GFR calc non Af Amer: 12 mL/min — ABNORMAL LOW (ref >60)
GLUCOSE: 140 mg/dL — AB (ref 65–99)
Potassium: 3.4 mmol/L — ABNORMAL LOW (ref 3.5–5.1)
SODIUM: 137 mmol/L (ref 135–145)

## 2014-10-17 LAB — HEPATITIS C ANTIBODY: HCV Ab: <0.1 s/co ratio (ref 0.0–0.9)

## 2014-10-17 LAB — COMPREHENSIVE METABOLIC PANEL
ALT: 12 U/L — ABNORMAL LOW (ref 17–63)
AST: 25 U/L (ref 15–41)
Albumin: 3.5 g/dL (ref 3.5–5.0)
Alkaline Phosphatase: 160 U/L — ABNORMAL HIGH (ref 38–126)
Anion gap: 17 — ABNORMAL HIGH (ref 5–15)
BUN: 28 mg/dL — ABNORMAL HIGH (ref 6–20)
CHLORIDE: 93 mmol/L — AB (ref 101–111)
CO2: 26 mmol/L (ref 22–32)
Calcium: 10 mg/dL (ref 8.9–10.3)
Creatinine, Ser: 5.21 mg/dL — ABNORMAL HIGH (ref 0.61–1.24)
GFR, EST AFRICAN AMERICAN: 13 mL/min — AB (ref >60)
GFR, EST NON AFRICAN AMERICAN: 12 mL/min — AB (ref >60)
Glucose, Bld: 140 mg/dL — ABNORMAL HIGH (ref 65–99)
Potassium: 3.4 mmol/L — ABNORMAL LOW (ref 3.5–5.1)
SODIUM: 136 mmol/L (ref 135–145)
TOTAL PROTEIN: 8.2 g/dL — AB (ref 6.5–8.1)
Total Bilirubin: 2.2 mg/dL — ABNORMAL HIGH (ref 0.3–1.2)

## 2014-10-17 LAB — IRON AND TIBC
Iron: 21 ug/dL — ABNORMAL LOW (ref 45–182)
Saturation Ratios: 8 % — ABNORMAL LOW (ref 17.9–39.5)
TIBC: 256 ug/dL (ref 250–450)
UIBC: 235 ug/dL

## 2014-10-17 LAB — LACTATE DEHYDROGENASE: LDH: 149 U/L (ref 98–192)

## 2014-10-17 LAB — EXPECTORATED SPUTUM ASSESSMENT W REFEX TO RESP CULTURE

## 2014-10-17 LAB — PTH, INTACT AND CALCIUM
Calcium, Total (PTH): 10.1 mg/dL (ref 8.7–10.2)
PTH: 69 pg/mL — ABNORMAL HIGH (ref 15–65)

## 2014-10-17 LAB — EXPECTORATED SPUTUM ASSESSMENT W GRAM STAIN, RFLX TO RESP C

## 2014-10-17 LAB — AMMONIA: Ammonia: 26 umol/L (ref 9–35)

## 2014-10-17 LAB — PROTIME-INR
INR: 1.28 (ref 0.00–1.49)
Prothrombin Time: 16.1 seconds — ABNORMAL HIGH (ref 11.6–15.2)

## 2014-10-17 LAB — HEPATITIS B CORE ANTIBODY, TOTAL: HEP B C TOTAL AB: NEGATIVE

## 2014-10-17 LAB — HEPATITIS B SURFACE ANTIBODY, QUANTITATIVE: Hepatitis B-Post: 666.5 m[IU]/mL (ref >9.9)

## 2014-10-17 LAB — FERRITIN: Ferritin: 857 ng/mL — ABNORMAL HIGH (ref 24–336)

## 2014-10-17 LAB — HEPATITIS B SURFACE ANTIGEN: HEP B S AG: NEGATIVE

## 2014-10-17 MED ORDER — LIDOCAINE HCL (PF) 1 % IJ SOLN
INTRAMUSCULAR | Status: AC
  Filled 2014-10-17: qty 10

## 2014-10-17 NOTE — Progress Notes (Signed)
    Consulting cardiologist: Dr. Zoila Shutter  Seen for followup: Atrial flutter  Subjective:    Intubated, awake with suctioning.  Objective:   Recent blood pressure 140/66  HR 115  Telemetry: Sinus tachycardia versus atrial flutter with 2:1 block  Exam:  General: Intubated on ventilator.  Lungs: Coarse breath sounds.  Cardiac: RRR without gallop.  Extremities: Mild edema.  Lab Results:  Basic Metabolic Panel:  Recent Labs Lab 10/15/14 0613 10/16/14 0500 10/17/14 0532 10/17/14 0533  NA 130* 129* 137 136  K 4.4 4.8 3.4* 3.4*  CL 88* 87* 94* 93*  CO2 24 23 27 26   GLUCOSE 80 122* 140* 140*  BUN 37* 50* 27* 28*  CREATININE 6.18* 7.51* 5.21* 5.21*  CALCIUM 10.0 9.7 10.1 10.0  MG 2.5*  --   --   --     Liver Function Tests:  Recent Labs Lab 10/15/14 0613 10/16/14 0500 10/17/14 0533  AST 32  --  25  ALT 13*  --  12*  ALKPHOS 190*  --  160*  BILITOT 1.8*  --  2.2*  PROT 7.9  --  8.2*  ALBUMIN 3.3* 3.2* 3.5    CBC:  Recent Labs Lab 10/15/14 0613 10/16/14 0500 10/17/14 0532  WBC 6.2 6.4 7.4  HGB 10.2* 9.1* 9.1*  HCT 32.6* 29.1* 29.3*  MCV 90.1 88.2 87.7  PLT 230 215 261    Cardiac Enzymes:  Recent Labs Lab 10/15/14 1519  TROPONINI 0.51*     Echocardiogram 10/16/2014: Study Conclusions  - Left ventricle: Akinesis inferior wall and inferolateral wall. Other walls hypo. EF 30% The cavity size was mildly dilated. Wall thickness was normal. - Mitral valve: Considering all views and data, the MR is probably moderately severe. - Left atrium: The atrium was moderately dilated. - Right ventricle: The cavity size was moderately dilated. Systolic function was mildly to moderately reduced. - Right atrium: The atrium was mildly dilated.    Cardiac Medications:   Metoprolol 50 mg Q8 hours Lovenox 30 mg SQ daily  Assessment:   1. Atrial flutter, duration uncertain. Heart rate 100-115, difficult to exclude sinus tachycardia by  telemetry, but could be 2:1 atrial flutter with better heart rate response on metoprolol. CHADSVASC score is at least 3.  2. Cardiomyopathy, LVEF approximately 30%.  3. Moderate to severe mitral regurgitation by recent echocardiogram.   Plan/Discussion:    Recheck ECG. Continue metoprolol. Would need input from primary team when and if anticoagulation would be appropriate. He would need anticoagulation in order to be considered for cardioversion. Difficult to know how effective this would be in light of severe comorbid illness however.   Jonelle Sidle, M.D., F.A.C.C.

## 2014-10-18 ENCOUNTER — Other Ambulatory Visit (HOSPITAL_COMMUNITY): Payer: Self-pay

## 2014-10-18 LAB — BODY FLUID CELL COUNT WITH DIFFERENTIAL
Eos, Fluid: 19 %
Lymphs, Fluid: 64 %
Monocyte-Macrophage-Serous Fluid: 6 % — ABNORMAL LOW (ref 50–90)
NEUTROPHIL FLUID: 11 % (ref 0–25)
WBC FLUID: 592 uL (ref 0–1000)

## 2014-10-18 LAB — PROTEIN, BODY FLUID: TOTAL PROTEIN, FLUID: 4 g/dL

## 2014-10-18 MED ORDER — LIDOCAINE HCL (PF) 1 % IJ SOLN
INTRAMUSCULAR | Status: AC
  Filled 2014-10-18: qty 10

## 2014-10-18 NOTE — Procedures (Signed)
Successful US guided left thoracentesis. Yielded of clear, dark yellow fluid. Pt tolerated procedure well. No immediate complications.  Specimen was sent for labs. CXR ordered.  Brayton El PA-C 10/18/2014 9:58 AM

## 2014-10-19 ENCOUNTER — Other Ambulatory Visit (HOSPITAL_COMMUNITY): Payer: Self-pay

## 2014-10-19 DIAGNOSIS — D531 Other megaloblastic anemias, not elsewhere classified: Secondary | ICD-10-CM

## 2014-10-19 DIAGNOSIS — Z4659 Encounter for fitting and adjustment of other gastrointestinal appliance and device: Secondary | ICD-10-CM | POA: Insufficient documentation

## 2014-10-19 DIAGNOSIS — J9600 Acute respiratory failure, unspecified whether with hypoxia or hypercapnia: Secondary | ICD-10-CM

## 2014-10-19 DIAGNOSIS — Z87898 Personal history of other specified conditions: Secondary | ICD-10-CM

## 2014-10-19 DIAGNOSIS — E1310 Other specified diabetes mellitus with ketoacidosis without coma: Secondary | ICD-10-CM

## 2014-10-19 DIAGNOSIS — I483 Typical atrial flutter: Secondary | ICD-10-CM

## 2014-10-19 LAB — PROTEIN ELECTROPHORESIS, SERUM
A/G Ratio: 0.9 (ref 0.7–2.0)
ALPHA-1-GLOBULIN: 0.5 g/dL — AB (ref 0.1–0.4)
ALPHA-2-GLOBULIN: 0.6 g/dL (ref 0.4–1.2)
Albumin ELP: 3.8 g/dL (ref 2.9–4.4)
BETA GLOBULIN: 1.1 g/dL (ref 0.6–1.3)
Gamma Globulin: 1.9 g/dL — ABNORMAL HIGH (ref 0.5–1.6)
Globulin, Total: 4 g/dL (ref 2.0–4.5)
TOTAL PROTEIN ELP: 7.8 g/dL (ref 6.0–8.5)

## 2014-10-19 LAB — RENAL FUNCTION PANEL
ANION GAP: 18 — AB (ref 5–15)
Albumin: 3.5 g/dL (ref 3.5–5.0)
BUN: 42 mg/dL — AB (ref 6–20)
CO2: 25 mmol/L (ref 22–32)
Calcium: 10 mg/dL (ref 8.9–10.3)
Chloride: 93 mmol/L — ABNORMAL LOW (ref 101–111)
Creatinine, Ser: 7.36 mg/dL — ABNORMAL HIGH (ref 0.61–1.24)
GFR calc Af Amer: 9 mL/min — ABNORMAL LOW (ref >60)
GFR calc non Af Amer: 8 mL/min — ABNORMAL LOW (ref >60)
Glucose, Bld: 99 mg/dL (ref 65–99)
Phosphorus: 6 mg/dL — ABNORMAL HIGH (ref 2.5–4.6)
Potassium: 3.5 mmol/L (ref 3.5–5.1)
SODIUM: 136 mmol/L (ref 135–145)

## 2014-10-19 LAB — HEPARIN LEVEL (UNFRACTIONATED)
HEPARIN UNFRACTIONATED: 0.29 [IU]/mL — AB (ref 0.30–0.70)
HEPARIN UNFRACTIONATED: 0.67 [IU]/mL (ref 0.30–0.70)
Heparin Unfractionated: <0.1 IU/mL — ABNORMAL LOW (ref 0.30–0.70)

## 2014-10-19 LAB — CBC
HCT: 27.7 % — ABNORMAL LOW (ref 39.0–52.0)
Hemoglobin: 8.5 g/dL — ABNORMAL LOW (ref 13.0–17.0)
MCH: 26.9 pg (ref 26.0–34.0)
MCHC: 30.7 g/dL (ref 30.0–36.0)
MCV: 87.7 fL (ref 78.0–100.0)
PLATELETS: 303 10*3/uL (ref 150–400)
RBC: 3.16 MIL/uL — AB (ref 4.22–5.81)
RDW: 16.8 % — AB (ref 11.5–15.5)
WBC: 7.2 10*3/uL (ref 4.0–10.5)

## 2014-10-19 LAB — AMYLASE, PLEURAL FLUID: Amylase, Pleural Fluid: 20 U/L

## 2014-10-19 LAB — CULTURE, RESPIRATORY W GRAM STAIN
Culture: NORMAL
Gram Stain: NONE SEEN

## 2014-10-19 LAB — PATHOLOGIST SMEAR REVIEW

## 2014-10-19 LAB — CULTURE, RESPIRATORY

## 2014-10-19 NOTE — Progress Notes (Signed)
Patient ID: Harold Mayo, male   DOB: 08/07/1960, 54 y.o.   MRN: [id number]    Consulting cardiologist: Dr. Zoila Shutter  Seen for followup: Atrial flutter  Subjective:    Intubated, awake but not interactive.  Does not follow commands.   Objective:   Recent blood pressure 149/60 HR 90s  Telemetry: Looks like NSR  Exam:  General: Intubated on ventilator.  Lungs: Coarse breath sounds bilaterally.  Cardiac: Reg S1S2, 1/6 HSM apex, +S3  Extremities: No edema.  Neck: No JVD  Neuro: awake but not following any commands  Lab Results:  Basic Metabolic Panel:  Recent Labs Lab 10/15/14 0613  10/17/14 0532 10/17/14 0533 10/19/14 0218  NA 130*  < > 137 136 136  K 4.4  < > 3.4* 3.4* 3.5  CL 88*  < > 94* 93* 93*  CO2 24  < > 27 26 25   GLUCOSE 80  < > 140* 140* 99  BUN 37*  < > 27* 28* 42*  CREATININE 6.18*  < > 5.21* 5.21* 7.36*  CALCIUM 10.0  < > 10.1 10.0 10.0  MG 2.5*  --   --   --   --   < > = values in this interval not displayed.  Liver Function Tests:  Recent Labs Lab 10/15/14 0613 10/16/14 0500 10/17/14 0533 10/19/14 0218  AST 32  --  25  --   ALT 13*  --  12*  --   ALKPHOS 190*  --  160*  --   BILITOT 1.8*  --  2.2*  --   PROT 7.9  --  8.2*  --   ALBUMIN 3.3* 3.2* 3.5 3.5    CBC:  Recent Labs Lab 10/16/14 0500 10/17/14 0532 10/19/14 0219  WBC 6.4 7.4 7.2  HGB 9.1* 9.1* 8.5*  HCT 29.1* 29.3* 27.7*  MCV 88.2 87.7 87.7  PLT 215 261 303    Cardiac Enzymes:  Recent Labs Lab 10/15/14 1519  TROPONINI 0.51*     Echocardiogram 10/16/2014: Study Conclusions  - Left ventricle: Akinesis inferior wall and inferolateral wall. Other walls hypo. EF 30% The cavity size was mildly dilated. Wall thickness was normal. - Mitral valve: Considering all views and data, the MR is probably moderately severe. - Left atrium: The atrium was moderately dilated. - Right ventricle: The cavity size was moderately dilated. Systolic function  was mildly to moderately reduced. - Right atrium: The atrium was mildly dilated.    Cardiac Medications:   Metoprolol 50 mg Q8 hours Heparin gtt  Assessment/Plan:   1. Atrial flutter: Paroxysmal, appears to be in NSR this morning. CHADSVASC score is at least 3.  He is on heparin gtt, will start warfarin.   2. Cardiomyopathy:  LVEF approximately 30%.  With regional wall motion abnormalities, concerned for ischemic cardiomyopathy.  Patient currently encephalopathic, intubated, unable to follow commands.  If he recovers cognitively, should have cardiac cath eventually.  He has S3 on exam but no JVD.  Controlling volume via HD.   3. Moderate to severe mitral regurgitation by recent echocardiogram: Murmur is not prominent.   4. ESRD  5. Chronic hypoxemic respiratory failure: Trach with vent support.   Marca Ancona 10/19/2014 9:31 AM

## 2014-10-19 NOTE — Progress Notes (Signed)
Subjective:  Pt seen during HD. Remains on vent. Case discussed with pulm/cc. They request daily HD for volume removal to aid with vent weaning.  Objective:  Vital signs in last 24 hours:   98.7 97 26 157/105  Weight change:  There were no vitals filed for this visit.   Physical Exam: General: On the vent  HEENT [place]/AT OM moist  Neck Trach in place  Pulm/lungs Bilateral rhonchi, Vent assisted/ Fio2 35%  CVS/Heart S1S2 irregular  Abdomen:  Distended, decreased BS, wound vac in place, colosotomy in place  Extremities: 1+ pitting edema  Neurologic: Lethargic but arousable, follows simple commands  Skin: No acute rashes  Access: Left radiocephalic AVF       Basic Metabolic Panel:  Recent Labs Lab 10/15/14 0613 10/16/14 0500 10/17/14 0532 10/17/14 0533 10/19/14 0218  NA 130* 129* 137 136 136  K 4.4 4.8 3.4* 3.4* 3.5  CL 88* 87* 94* 93* 93*  CO2 GLUCOSE 80 122* 140* 140* 99  BUN 37* 50* 27* 28* 42*  CREATININE 6.18* 7.51* 5.21* 5.21* 7.36*  CALCIUM 10.0 9.7  10.1 10.1 10.0 10.0  MG 2.5*  --   --   --   --   PHOS 5.9* 6.1*  --   --  6.0*     CBC:  Recent Labs Lab 10/15/14 0613 10/16/14 0500 10/17/14 0532 10/19/14 0219  WBC 6.2 6.4 7.4 7.2  HGB 10.2* 9.1* 9.1* 8.5*  HCT 32.6* 29.1* 29.3* 27.7*  MCV 90.1 88.2 87.7 87.7  PLT 230 215 261 303      Microbiology: Results for orders placed or performed during the hospital encounter of 10/14/14  Culture, blood (routine x 2)     Status: None (Preliminary result)   Collection Time: 10/16/14  9:16 AM  Result Value Ref Range Status   Specimen Description BLOOD PICC LINE  Final   Special Requests BOTTLES DRAWN AEROBIC AND ANAEROBIC  Final   Culture   Final           BLOOD CULTURE RECEIVED NO GROWTH TO DATE CULTURE WILL BE HELD FOR 5 DAYS BEFORE ISSUING A FINAL NEGATIVE REPORT Performed at [facility]    Report Status PENDING  Incomplete  Culture, blood (routine x 2)     Status:  None (Preliminary result)   Collection Time: 10/16/14 10:25 AM  Result Value Ref Range Status   Specimen Description BLOOD RIGHT HAND  Final   Special Requests BOTTLES DRAWN AEROBIC ONLY 10CC  Final   Culture   Final           BLOOD CULTURE RECEIVED NO GROWTH TO DATE CULTURE WILL BE HELD FOR 5 DAYS BEFORE ISSUING A FINAL NEGATIVE REPORT Performed at [facility]    Report Status PENDING  Incomplete  Culture, blood (routine x 2)     Status: None (Preliminary result)   Collection Time: 10/16/14 10:53 PM  Result Value Ref Range Status   Specimen Description BLOOD RIGHT ANTECUBITAL  Final   Special Requests BOTTLES DRAWN AEROBIC AND ANAEROBIC 10CC  Final   Culture   Final           BLOOD CULTURE RECEIVED NO GROWTH TO DATE CULTURE WILL BE HELD FOR 5 DAYS BEFORE ISSUING A FINAL NEGATIVE REPORT Performed at [facility]    Report Status PENDING  Incomplete  Culture, blood (routine x 2)     Status: None (Preliminary result)   Collection Time:  10/16/14 11:00 PM  Result Value Ref Range Status   Specimen Description BLOOD PICC LINE  Final   Special Requests BOTTLES DRAWN AEROBIC AND ANAEROBIC 10 CC  Final   Culture   Final           BLOOD CULTURE RECEIVED NO GROWTH TO DATE CULTURE WILL BE HELD FOR 5 DAYS BEFORE ISSUING A FINAL NEGATIVE REPORT Performed at [facility]    Report Status PENDING  Incomplete  Culture, expectorated sputum-assessment     Status: None   Collection Time: 10/17/14  6:15 AM  Result Value Ref Range Status   Specimen Description SPUTUM  Final   Special Requests NONE  Final   Sputum evaluation   Final    THIS SPECIMEN IS ACCEPTABLE. RESPIRATORY CULTURE REPORT TO FOLLOW.   Report Status 10/17/2014 FINAL  Final  Culture, respiratory (NON-Expectorated)     Status: None   Collection Time: 10/17/14  8:00 AM  Result Value Ref Range Status   Specimen Description SPUTUM  Final   Special Requests NONE  Final   Gram Stain   Final    NO WBC SEEN NO  SQUAMOUS EPITHELIAL CELLS SEEN RARE GRAM POSITIVE COCCI IN PAIRS Performed at [facility]    Culture   Final    NORMAL OROPHARYNGEAL FLORA Performed at [facility]    Report Status 10/19/2014 FINAL  Final  Body fluid culture     Status: None (Preliminary result)   Collection Time: 10/18/14 10:07 AM  Result Value Ref Range Status   Specimen Description PLEURAL LEFT FLUID  Final   Special Requests NONE  Final   Gram Stain   Final    NO WBC SEEN NO ORGANISMS SEEN Performed at [facility]    Culture NO GROWTH Performed at [facility]   Final   Report Status PENDING  Incomplete    Coagulation Studies:  Recent Labs  10/17/14 0533  LABPROT 16.1*  INR 1.28    Urinalysis: No results for input(s): COLORURINE, LABSPEC, PHURINE, GLUCOSEU, HGBUR, BILIRUBINUR, KETONESUR, PROTEINUR, UROBILINOGEN, NITRITE, LEUKOCYTESUR in the last 72 hours.  Invalid input(s): APPERANCEUR    Imaging: US Abdomen Complete  10/18/2014   CLINICAL DATA:  Right upper quadrant pain  EXAM: ULTRASOUND ABDOMEN COMPLETE  COMPARISON:  10/16/2014  FINDINGS: Gallbladder: Decompressed with evidence of a gallstone within the neck of the gallbladder. The wall is thickened at 7.6 mm although portion of this is likely related to underlying ascites as well as the decompressed state  Common bile duct: Diameter: 6.9 mm.  Liver: Heterogeneous with some nodular component consistent with underlying cirrhosis.  IVC: No abnormality visualized.  Pancreas: Not well visualized.  Spleen: Size and appearance within normal limits.  Right Kidney: Length: 9.6 cm. Multiple cysts are seen. The largest of these measures 2.2 cm.  Left Kidney: Length: 10.1 cm.  1.4 cm cyst in the midportion.  Abdominal aorta: No aneurysm visualized.  Other findings: Ascites is noted  IMPRESSION: Cholelithiasis. Gallbladder wall thickening is noted although a portion of this is likely related to a decompressed state as well as  ascites.  Ascites predominately surrounding the liver.  Changes of cirrhosis of the liver.  Renal cysts.   Electronically Signed   By: Alcide Clever M.D.   On: 10/18/2014 10:47   Dg Chest Port 1 View  10/18/2014   CLINICAL DATA:  54 year old male with history of pleural effusions status post left thoracentesis.  EXAM: PORTABLE CHEST - 1 VIEW  COMPARISON:  Chest x-ray 10/16/2014.  FINDINGS: There is a right upper extremity PICC with tip terminating in the distal superior vena cava. Tracheostomy tube in position with tip 4.8 cm above the carina. A nasogastric tube is seen extending into the stomach, however, the tip of the nasogastric tube extends below the lower margin of the image. Multifocal airspace disease throughout the mid to lower lungs bilaterally again noted. Decreased size of what is now a small to moderate left pleural effusion. No pneumothorax. No right pleural effusion. Cephalization of the pulmonary vasculature. Mild diffuse indistinctness of the interstitial markings. Mild cardiomegaly. Upper mediastinal contours are similar to the prior examination, widened slightly likely related to lordotic positioning, supine AP portable technique and slight low lung volumes.  IMPRESSION: 1. Decreased size of what is now a small to moderate-sized left-sided pleural effusion following thoracentesis. No pneumothorax. 2. Cardiomegaly with evidence of mild pulmonary edema. 3. Likely additional areas of atelectasis in the lower lungs bilaterally.   Electronically Signed   By: Trudie Reed M.D.   On: 10/18/2014 10:24   US Thoracentesis Asp Pleural Space W/img Guide  10/18/2014   CLINICAL DATA:  Respiratory failure. Left-sided pleural effusion. Request diagnostic and therapeutic thoracentesis.  EXAM: ULTRASOUND GUIDED LEFT THORACENTESIS  COMPARISON:  None.  PROCEDURE: An ultrasound guided thoracentesis was thoroughly discussed with the patient and questions answered. The benefits, risks, alternatives and  complications were also discussed. The patient understands and wishes to proceed with the procedure. Written consent was obtained.  Ultrasound was performed to localize and mark an adequate pocket of fluid in the left chest. The area was then prepped and draped in the normal sterile fashion. 1% Lidocaine was used for local anesthesia. Under ultrasound guidance a 19 gauge Yueh catheter was introduced. Thoracentesis was performed. The catheter was removed and a dressing applied.  COMPLICATIONS: None immediate.  FINDINGS: A total of approximately 550 mL of clear, dark yellow fluid was removed. A fluid sample wassent for laboratory analysis.  IMPRESSION: Successful ultrasound guided left thoracentesis yielding 550 mL of pleural fluid.  Read by: Brayton El PA-C   Electronically Signed   By: Malachy Moan M.D.   On: 10/18/2014 10:16     Medications:       Assessment/ Plan:  54 y.o. male  with  ESRD, HTN, CHF, Previous alcohol abuse, tobacco abse, , was admitted to [facility] from [facility] on 10/14/2014 with resp failure. He is s/p Ex Lap, sigmoid colectomy and colostomy placement. He has a wound vac. Regular dialysis at [facility] center in [place], [place]  1. ESRD - Pt seen during HD, tolerating well, UF target 3.5kg.  Pulm/CC has requested daily dialysis, will plan for that M-F this week.  2. AOCKD - hgb 9.1, continue aranesp, follow CBC.  3. SHPTH - phos 6.0, avoiding binders for now given abdominal issues  4. Abdominal distention  - s/p paracentesis.  5.  Acute respiratory failure:  Remains on vent with trach, asked to increase HD to daily to remove additional volume, will set that up for this week.  Ociel Retherford 6/13/20162:57 PM

## 2014-10-19 NOTE — Consult Note (Signed)
Name: Harold Mayo MRN: [id number] DOB: March 22, 1961    ADMISSION DATE:  10/14/2014 CONSULTATION DATE: 10/15/2014  REFERRING MD :  [facility]  CHIEF COMPLAINT:  Resp failure  BRIEF PATIENT DESCRIPTION:  54 yo male admitted to [facility] 09/10/14 with abdominal pain.  Had laparotomy, colon resection and abscess drainage.  Developed respiratory failure with failure to wean from vent and required tracheostomy 6/01.  Transferred to [facility] 6/08 for wound care, rehab, and vent weaning.  SUBJECTIVE:  Weaning, confused enceph  VITAL SIGNS: 128/82 98.7 F 97 rr 26  PHYSICAL EXAMINATION: General: trach Neuro:  Sedated rass plus 1, follow simple commands HEENT: Tracheostomy 8 XLT Cardiovascular: HSR RRR Lungs: redcued Abdomen:  Open wound with mid line vac, colostomy Musculoskeletal:  intact Skin: warm and dry    CMP Latest Ref Rng 10/19/2014 10/17/2014 10/17/2014  Glucose 65 - 99 mg/dL 99 409(W) 119(J)  BUN 6 - 20 mg/dL 47(W) 29(F) 62(Z)  Creatinine 0.61 - 1.24 mg/dL 3.08(M) 5.78(I) 6.96(E)  Sodium 135 - 145 mmol/L 136 136 137  Potassium 3.5 - 5.1 mmol/L 3.5 3.4(L) 3.4(L)  Chloride 101 - 111 mmol/L 93(L) 93(L) 94(L)  CO2 22 - 32 mmol/L Calcium 8.9 - 10.3 mg/dL 95.2 84.1 32.4  Total Protein 6.5 - 8.1 g/dL - 8.2(H) -  Total Bilirubin 0.3 - 1.2 mg/dL - 2.2(H) -  Alkaline Phos 38 - 126 U/L - 160(H) -  AST 15 - 41 U/L - 25 -  ALT 17 - 63 U/L - 12(L) -    CBC Latest Ref Rng 10/19/2014 10/17/2014 10/16/2014  WBC 4.0 - 10.5 K/uL 7.2 7.4 6.4  Hemoglobin 13.0 - 17.0 g/dL 4.0(N) 0.2(V) 2.5(D)  Hematocrit 39.0 - 52.0 % 27.7(L) 29.3(L) 29.1(L)  Platelets 150 - 400 K/uL 303 261 215    ABG    Component Value Date/Time   PHART 7.345* 10/16/2014 0500   PCO2ART 45.7* 10/16/2014 0500   PO2ART 84.1 10/16/2014 0500   HCO3 24.3* 10/16/2014 0500   TCO2 25.7 10/16/2014 0500   ACIDBASEDEF 0.6 10/16/2014 0500   O2SAT 95.1 10/16/2014 0500    US Abdomen Complete  10/18/2014    CLINICAL DATA:  Right upper quadrant pain  EXAM: ULTRASOUND ABDOMEN COMPLETE  COMPARISON:  10/16/2014  FINDINGS: Gallbladder: Decompressed with evidence of a gallstone within the neck of the gallbladder. The wall is thickened at 7.6 mm although portion of this is likely related to underlying ascites as well as the decompressed state  Common bile duct: Diameter: 6.9 mm.  Liver: Heterogeneous with some nodular component consistent with underlying cirrhosis.  IVC: No abnormality visualized.  Pancreas: Not well visualized.  Spleen: Size and appearance within normal limits.  Right Kidney: Length: 9.6 cm. Multiple cysts are seen. The largest of these measures 2.2 cm.  Left Kidney: Length: 10.1 cm.  1.4 cm cyst in the midportion.  Abdominal aorta: No aneurysm visualized.  Other findings: Ascites is noted  IMPRESSION: Cholelithiasis. Gallbladder wall thickening is noted although a portion of this is likely related to a decompressed state as well as ascites.  Ascites predominately surrounding the liver.  Changes of cirrhosis of the liver.  Renal cysts.   Electronically Signed   By: Alcide Clever M.D.   On: 10/18/2014 10:47   Dg Chest Port 1 View  10/18/2014   CLINICAL DATA:  54 year old male with history of pleural effusions status post left thoracentesis.  EXAM: PORTABLE CHEST - 1 VIEW  COMPARISON:  Chest x-ray  10/16/2014.  FINDINGS: There is a right upper extremity PICC with tip terminating in the distal superior vena cava. Tracheostomy tube in position with tip 4.8 cm above the carina. A nasogastric tube is seen extending into the stomach, however, the tip of the nasogastric tube extends below the lower margin of the image. Multifocal airspace disease throughout the mid to lower lungs bilaterally again noted. Decreased size of what is now a small to moderate left pleural effusion. No pneumothorax. No right pleural effusion. Cephalization of the pulmonary vasculature. Mild diffuse indistinctness of the interstitial  markings. Mild cardiomegaly. Upper mediastinal contours are similar to the prior examination, widened slightly likely related to lordotic positioning, supine AP portable technique and slight low lung volumes.  IMPRESSION: 1. Decreased size of what is now a small to moderate-sized left-sided pleural effusion following thoracentesis. No pneumothorax. 2. Cardiomegaly with evidence of mild pulmonary edema. 3. Likely additional areas of atelectasis in the lower lungs bilaterally.   Electronically Signed   By: Trudie Reed M.D.   On: 10/18/2014 10:24   US Thoracentesis Asp Pleural Space W/img Guide  10/18/2014   CLINICAL DATA:  Respiratory failure. Left-sided pleural effusion. Request diagnostic and therapeutic thoracentesis.  EXAM: ULTRASOUND GUIDED LEFT THORACENTESIS  COMPARISON:  None.  PROCEDURE: An ultrasound guided thoracentesis was thoroughly discussed with the patient and questions answered. The benefits, risks, alternatives and complications were also discussed. The patient understands and wishes to proceed with the procedure. Written consent was obtained.  Ultrasound was performed to localize and mark an adequate pocket of fluid in the left chest. The area was then prepped and draped in the normal sterile fashion. 1% Lidocaine was used for local anesthesia. Under ultrasound guidance a 19 gauge Yueh catheter was introduced. Thoracentesis was performed. The catheter was removed and a dressing applied.  COMPLICATIONS: None immediate.  FINDINGS: A total of approximately 550 mL of clear, dark yellow fluid was removed. A fluid sample wassent for laboratory analysis.  IMPRESSION: Successful ultrasound guided left thoracentesis yielding 550 mL of pleural fluid.  Read by: Brayton El PA-C   Electronically Signed   By: Malachy Moan M.D.   On: 10/18/2014 10:16    ASSESSMENT   Acute respiratory failure after laparotomy. Failure to wean from ventilatory s/p tracheostomy. Hx of tobacco abuse. bilat  effusions Plan: - unlikley thoracentesis to improve weaning, would concentrate on neg balance success - consider further neg balance and follow pcxr for effusion resolution -i see no LDH from the fluid either -4 hr PS 12 goal -need to continue to treat delirium aggressive -repeat pcxr for edema resolution as HD takes place  Sigmoid colectomy, colostomy, Hartmann pouch with open wound and wound vac. Plan: - wound care, nutrition per primary team  Hx of ESRD, DM type II, HTN, GERD, A flutter. S/p partial thyroidectomy, parathyroidectomy 10/07/14. Plan: - per primary team - for maximizing neg balance, see pulm  Delerium -assess qtc, may need IV haldol and avoidance benzo as able  Mcarthur Rossetti. Tyson Alias, MD, FACP Pgr: [phone] [facility]

## 2014-10-20 LAB — RENAL FUNCTION PANEL
Albumin: 3.7 g/dL (ref 3.5–5.0)
Anion gap: 18 — ABNORMAL HIGH (ref 5–15)
BUN: 23 mg/dL — AB (ref 6–20)
CALCIUM: 10.1 mg/dL (ref 8.9–10.3)
CO2: 26 mmol/L (ref 22–32)
CREATININE: 5.27 mg/dL — AB (ref 0.61–1.24)
Chloride: 97 mmol/L — ABNORMAL LOW (ref 101–111)
GFR calc Af Amer: 13 mL/min — ABNORMAL LOW (ref >60)
GFR calc non Af Amer: 11 mL/min — ABNORMAL LOW (ref >60)
Glucose, Bld: 67 mg/dL (ref 65–99)
Phosphorus: 4.4 mg/dL (ref 2.5–4.6)
Potassium: 4 mmol/L (ref 3.5–5.1)
Sodium: 141 mmol/L (ref 135–145)

## 2014-10-20 LAB — CBC
HEMATOCRIT: 33.4 % — AB (ref 39.0–52.0)
HEMOGLOBIN: 10.2 g/dL — AB (ref 13.0–17.0)
MCH: 27.2 pg (ref 26.0–34.0)
MCHC: 30.5 g/dL (ref 30.0–36.0)
MCV: 89.1 fL (ref 78.0–100.0)
Platelets: 296 10*3/uL (ref 150–400)
RBC: 3.75 MIL/uL — AB (ref 4.22–5.81)
RDW: 17.5 % — ABNORMAL HIGH (ref 11.5–15.5)
WBC: 7 10*3/uL (ref 4.0–10.5)

## 2014-10-20 LAB — VANCOMYCIN, TROUGH: VANCOMYCIN TR: 10 ug/mL (ref 10.0–20.0)

## 2014-10-20 LAB — HEPARIN LEVEL (UNFRACTIONATED)
HEPARIN UNFRACTIONATED: 0.47 [IU]/mL (ref 0.30–0.70)
Heparin Unfractionated: 0.41 IU/mL (ref 0.30–0.70)

## 2014-10-20 NOTE — Progress Notes (Addendum)
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 6/1 transection of isthmus of tyroid   DM (diabetes mellitus) type 2   ESRD on dialysis left forearm av shunt   Anemia   HTN (hypertension)   Diverticular disease   GERD (gastroesophageal reflux disease)   Agitation requiring sedation protocol, on diprivan   Hyperparathyroidism, surgical intervention 6/1   Atrial flutter   Encounter for feeding tube placement   Primary Cardiologist: Dr Rennis Golden saw 06/09  Patient Profile: 54 yo male w/ hx ESRD/HD, HTN, Aflutter, CHF, MR, admitted in [place] w/ diverticulitis, perf, abscess, septic shock. Had Hartman's colostomy & trach, tx to [facility] 06/08. Cards saw for aflutter  SUBJECTIVE: Pt awake agitated, does not follow with eyes or follow commands.  OBJECTIVE Filed Vitals:   10/20/14 0946  BP: 128/71  HR 103  RESP 29  O2 98%  I/O 867/4500    PHYSICAL EXAM General: Well developed, well nourished, male agitated, restless Head: Normocephalic, atraumatic.  Neck: Supple without bruits, JVD not seen elevated, difficult to assess 2nd body habitus. Lungs:  Resp regular and unlabored, few rales bases, good air exchange. Heart: Rapid RRR, S1, S2, + S3, no S4, soft murmur; no rub. Abdomen: distended and firm, BS present but decreased.  Extremities: No clubbing, cyanosis, edema.  Neuro: Alert and awake. Moves all extremities spontaneously but not responsive to commands.  LABS: CBC:  Recent Labs  10/19/14 0219 10/20/14 0630  WBC 7.2 7.0  HGB 8.5* 10.2*  HCT 27.7* 33.4*  MCV 87.7 89.1  PLT 303 296   Basic Metabolic Panel:  Recent Labs  16/10/96 0218 10/20/14 0630  NA 136 141  K 3.5 4.0  CL 93* 97*  CO2 25 26  GLUCOSE 99  67  BUN 42* 23*  CREATININE 7.36* 5.27*  CALCIUM 10.0 10.1  PHOS 6.0* 4.4   Liver Function Tests:  Recent Labs  10/19/14 0218 10/20/14 0630  ALBUMIN 3.5 3.7   TELE:  SR, ST, HR higher when agitated      Radiology/Studies: US Abdomen Complete 10/18/2014   CLINICAL DATA:  Right upper quadrant pain  EXAM: ULTRASOUND ABDOMEN COMPLETE  COMPARISON:  10/16/2014  FINDINGS: Gallbladder: Decompressed with evidence of a gallstone within the neck of the gallbladder. The wall is thickened at 7.6 mm although portion of this is likely related to underlying ascites as well as the decompressed state  Common bile duct: Diameter: 6.9 mm.  Liver: Heterogeneous with some nodular component consistent with underlying cirrhosis.  IVC: No abnormality visualized.  Pancreas: Not well visualized.  Spleen: Size and appearance within normal limits.  Right Kidney: Length: 9.6 cm. Multiple cysts are seen. The largest of these measures 2.2 cm.  Left Kidney: Length: 10.1 cm.  1.4 cm cyst in the midportion.  Abdominal aorta: No aneurysm visualized.  Other findings: Ascites is noted  IMPRESSION: Cholelithiasis. Gallbladder wall thickening is noted although a portion of this is likely related to a decompressed state as well as ascites.  Ascites predominately surrounding the liver.  Changes of cirrhosis of the liver.  Renal cysts.   Electronically Signed   By: Alcide Clever M.D.   On: 10/18/2014 10:47   Dg Chest Port 1 View 10/18/2014   CLINICAL  DATA:  54 year old male with history of pleural effusions status post left thoracentesis.  EXAM: PORTABLE CHEST - 1 VIEW  COMPARISON:  Chest x-ray 10/16/2014.  FINDINGS: There is a right upper extremity PICC with tip terminating in the distal superior vena cava. Tracheostomy tube in position with tip 4.8 cm above the carina. A nasogastric tube is seen extending into the stomach, however, the tip of the nasogastric tube extends below the lower margin of the image. Multifocal airspace disease  throughout the mid to lower lungs bilaterally again noted. Decreased size of what is now a small to moderate left pleural effusion. No pneumothorax. No right pleural effusion. Cephalization of the pulmonary vasculature. Mild diffuse indistinctness of the interstitial markings. Mild cardiomegaly. Upper mediastinal contours are similar to the prior examination, widened slightly likely related to lordotic positioning, supine AP portable technique and slight low lung volumes.  IMPRESSION: 1. Decreased size of what is now a small to moderate-sized left-sided pleural effusion following thoracentesis. No pneumothorax. 2. Cardiomegaly with evidence of mild pulmonary edema. 3. Likely additional areas of atelectasis in the lower lungs bilaterally.   Electronically Signed   By: Trudie Reed M.D.   On: 10/18/2014 10:24   Dg Abd Portable 1v 10/19/2014   CLINICAL DATA:  Abdominal distention today.  EXAM: PORTABLE ABDOMEN - 1 VIEW  COMPARISON:  Plain films the abdomen 10/15/2014 and CT abdomen and pelvis 10/16/2014.  FINDINGS: Mild gaseous distention of small and large bowel is identified. No evidence of free intraperitoneal air is seen on the single view. No abnormal abdominal calcification or focal bony abnormality is identified.  IMPRESSION: Findings most compatible with mild ileus.   Electronically Signed   By: Drusilla Kanner M.D.   On: 10/19/2014 17:15   US Thoracentesis Asp Pleural Space W/img Guide 10/18/2014   CLINICAL DATA:  Respiratory failure. Left-sided pleural effusion. Request diagnostic and therapeutic thoracentesis.  EXAM: ULTRASOUND GUIDED LEFT THORACENTESIS  COMPARISON:  None.  PROCEDURE: An ultrasound guided thoracentesis was thoroughly discussed with the patient and questions answered. The benefits, risks, alternatives and complications were also discussed. The patient understands and wishes to proceed with the procedure. Written consent was obtained.  Ultrasound was performed to localize and mark an  adequate pocket of fluid in the left chest. The area was then prepped and draped in the normal sterile fashion. 1% Lidocaine was used for local anesthesia. Under ultrasound guidance a 19 gauge Yueh catheter was introduced. Thoracentesis was performed. The catheter was removed and a dressing applied.  COMPLICATIONS: None immediate.  FINDINGS: A total of approximately 550 mL of clear, dark yellow fluid was removed. A fluid sample wassent for laboratory analysis.  IMPRESSION: Successful ultrasound guided left thoracentesis yielding 550 mL of pleural fluid.  Read by: Brayton El PA-C   Electronically Signed   By: Malachy Moan M.D.   On: 10/18/2014 10:16     Current Cardiac Medications:  Lopressor 50 mg q 8 hr, held 06/13 am pre-HD, other doses given   ASSESSMENT AND PLAN:  Atrial flutter - telemetry reviewed, over last > 24 hr, no flutter, just ST w/ 1st deg AVB - No other ectopy seen - agitation increases HR - QTC improved by manual measurement. - MD advise if we need to see daily or can see PRN.  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 6/1 transection of isthmus of tyroid   DM (diabetes mellitus) type 2   ESRD on dialysis left forearm av shunt   Anemia   HTN (hypertension)   Diverticular disease   GERD (gastroesophageal reflux disease)   Agitation requiring sedation protocol, on diprivan   Hyperparathyroidism, surgical intervention 6/1   Encounter for feeding tube placement   Signed, Barrett, Rhonda , PA-C 8:20 AM 10/20/2014  Agree with the above note, no changes.  No atrial flutter.  Would start warfarin .  Marca Ancona 10/20/2014 4:46 PM

## 2014-10-21 ENCOUNTER — Other Ambulatory Visit (HOSPITAL_COMMUNITY): Payer: Self-pay

## 2014-10-21 LAB — HEPARIN LEVEL (UNFRACTIONATED)
HEPARIN UNFRACTIONATED: 0.62 [IU]/mL (ref 0.30–0.70)
HEPARIN UNFRACTIONATED: 0.34 [IU]/mL (ref 0.30–0.70)
Heparin Unfractionated: 0.5 IU/mL (ref 0.30–0.70)

## 2014-10-21 LAB — CBC WITH DIFFERENTIAL/PLATELET
BASOS ABS: 0.1 10*3/uL (ref 0.0–0.1)
Basophils Relative: 1 % (ref 0–1)
EOS PCT: 5 % (ref 0–5)
Eosinophils Absolute: 0.3 10*3/uL (ref 0.0–0.7)
HCT: 29.5 % — ABNORMAL LOW (ref 39.0–52.0)
HEMOGLOBIN: 8.9 g/dL — AB (ref 13.0–17.0)
Lymphocytes Relative: 27 % (ref 12–46)
Lymphs Abs: 1.7 10*3/uL (ref 0.7–4.0)
MCH: 26.9 pg (ref 26.0–34.0)
MCHC: 30.2 g/dL (ref 30.0–36.0)
MCV: 89.1 fL (ref 78.0–100.0)
MONO ABS: 1.8 10*3/uL — AB (ref 0.1–1.0)
Monocytes Relative: 30 % — ABNORMAL HIGH (ref 3–12)
Neutro Abs: 2.3 10*3/uL (ref 1.7–7.7)
Neutrophils Relative %: 37 % — ABNORMAL LOW (ref 43–77)
PLATELETS: 306 10*3/uL (ref 150–400)
RBC: 3.31 MIL/uL — AB (ref 4.22–5.81)
RDW: 17.7 % — ABNORMAL HIGH (ref 11.5–15.5)
WBC: 6.2 10*3/uL (ref 4.0–10.5)

## 2014-10-21 LAB — COMPREHENSIVE METABOLIC PANEL
ALBUMIN: 3.9 g/dL (ref 3.5–5.0)
ALK PHOS: 119 U/L (ref 38–126)
ALT: 11 U/L — ABNORMAL LOW (ref 17–63)
ALT: 11 U/L — ABNORMAL LOW (ref 17–63)
ANION GAP: 15 (ref 5–15)
AST: 22 U/L (ref 15–41)
AST: 32 U/L (ref 15–41)
Albumin: 3.7 g/dL (ref 3.5–5.0)
Alkaline Phosphatase: 120 U/L (ref 38–126)
Anion gap: 18 — ABNORMAL HIGH (ref 5–15)
BUN: 14 mg/dL (ref 6–20)
BUN: 8 mg/dL (ref 6–20)
CALCIUM: 10.3 mg/dL (ref 8.9–10.3)
CALCIUM: 9.7 mg/dL (ref 8.9–10.3)
CO2: 27 mmol/L (ref 22–32)
CO2: 24 mmol/L (ref 22–32)
Chloride: 95 mmol/L — ABNORMAL LOW (ref 101–111)
Chloride: 97 mmol/L — ABNORMAL LOW (ref 101–111)
Creatinine, Ser: 4.04 mg/dL — ABNORMAL HIGH (ref 0.61–1.24)
Creatinine, Ser: 2.87 mg/dL — ABNORMAL HIGH (ref 0.61–1.24)
GFR calc Af Amer: 18 mL/min — ABNORMAL LOW (ref >60)
GFR calc Af Amer: 27 mL/min — ABNORMAL LOW (ref >60)
GFR calc non Af Amer: 16 mL/min — ABNORMAL LOW (ref >60)
GFR calc non Af Amer: 24 mL/min — ABNORMAL LOW (ref >60)
Glucose, Bld: 104 mg/dL — ABNORMAL HIGH (ref 65–99)
Glucose, Bld: 127 mg/dL — ABNORMAL HIGH (ref 65–99)
Potassium: 3.1 mmol/L — ABNORMAL LOW (ref 3.5–5.1)
Potassium: 4.1 mmol/L (ref 3.5–5.1)
Sodium: 137 mmol/L (ref 135–145)
Sodium: 139 mmol/L (ref 135–145)
TOTAL PROTEIN: 8.4 g/dL — AB (ref 6.5–8.1)
Total Bilirubin: 2.5 mg/dL — ABNORMAL HIGH (ref 0.3–1.2)
Total Bilirubin: 2.8 mg/dL — ABNORMAL HIGH (ref 0.3–1.2)
Total Protein: 8.9 g/dL — ABNORMAL HIGH (ref 6.5–8.1)

## 2014-10-21 LAB — PHOSPHORUS
PHOSPHORUS: 3.1 mg/dL (ref 2.5–4.6)
Phosphorus: 2.8 mg/dL (ref 2.5–4.6)

## 2014-10-21 LAB — RENAL FUNCTION PANEL
Albumin: 3.6 g/dL (ref 3.5–5.0)
Anion gap: 14 (ref 5–15)
BUN: 13 mg/dL (ref 6–20)
CALCIUM: 10.2 mg/dL (ref 8.9–10.3)
CO2: 28 mmol/L (ref 22–32)
CREATININE: 4.04 mg/dL — AB (ref 0.61–1.24)
Chloride: 95 mmol/L — ABNORMAL LOW (ref 101–111)
GFR calc Af Amer: 18 mL/min — ABNORMAL LOW (ref >60)
GFR, EST NON AFRICAN AMERICAN: 16 mL/min — AB (ref >60)
Glucose, Bld: 106 mg/dL — ABNORMAL HIGH (ref 65–99)
PHOSPHORUS: 3.1 mg/dL (ref 2.5–4.6)
POTASSIUM: 3.1 mmol/L — AB (ref 3.5–5.1)
Sodium: 137 mmol/L (ref 135–145)

## 2014-10-21 LAB — MAGNESIUM
MAGNESIUM: 2.2 mg/dL (ref 1.7–2.4)
Magnesium: 2.2 mg/dL (ref 1.7–2.4)

## 2014-10-21 LAB — TRIGLYCERIDES: TRIGLYCERIDES: 121 mg/dL (ref <150)

## 2014-10-21 MED ORDER — TECHNETIUM TC 99M MEBROFENIN IV KIT
5.0000 | PACK | Freq: Once | INTRAVENOUS | Status: AC | PRN
Start: 2014-10-21 — End: 2014-10-21
  Administered 2014-10-21: 5 via INTRAVENOUS

## 2014-10-21 NOTE — Progress Notes (Signed)
Came to see pt, but pt down for HIDA scan. Will prepare dialysis orders for the next two days.

## 2014-10-22 ENCOUNTER — Encounter: Payer: Self-pay | Admitting: Radiology

## 2014-10-22 DIAGNOSIS — K810 Acute cholecystitis: Secondary | ICD-10-CM | POA: Insufficient documentation

## 2014-10-22 LAB — COMPREHENSIVE METABOLIC PANEL
ALT: 13 U/L — ABNORMAL LOW (ref 17–63)
ANION GAP: 19 — AB (ref 5–15)
AST: 29 U/L (ref 15–41)
Albumin: 3.7 g/dL (ref 3.5–5.0)
Alkaline Phosphatase: 112 U/L (ref 38–126)
BUN: 13 mg/dL (ref 6–20)
CALCIUM: 9.7 mg/dL (ref 8.9–10.3)
CO2: 23 mmol/L (ref 22–32)
CREATININE: 3.6 mg/dL — AB (ref 0.61–1.24)
Chloride: 95 mmol/L — ABNORMAL LOW (ref 101–111)
GFR, EST AFRICAN AMERICAN: 21 mL/min — AB (ref >60)
GFR, EST NON AFRICAN AMERICAN: 18 mL/min — AB (ref >60)
Glucose, Bld: 125 mg/dL — ABNORMAL HIGH (ref 65–99)
Potassium: 4 mmol/L (ref 3.5–5.1)
SODIUM: 137 mmol/L (ref 135–145)
Total Bilirubin: 2.7 mg/dL — ABNORMAL HIGH (ref 0.3–1.2)
Total Protein: 8.7 g/dL — ABNORMAL HIGH (ref 6.5–8.1)

## 2014-10-22 LAB — BODY FLUID CULTURE
Culture: NO GROWTH
Gram Stain: NONE SEEN

## 2014-10-22 LAB — RENAL FUNCTION PANEL
Albumin: 3.7 g/dL (ref 3.5–5.0)
Anion gap: 20 — ABNORMAL HIGH (ref 5–15)
BUN: 13 mg/dL (ref 6–20)
CO2: 22 mmol/L (ref 22–32)
Calcium: 9.6 mg/dL (ref 8.9–10.3)
Chloride: 95 mmol/L — ABNORMAL LOW (ref 101–111)
Creatinine, Ser: 3.56 mg/dL — ABNORMAL HIGH (ref 0.61–1.24)
GFR calc Af Amer: 21 mL/min — ABNORMAL LOW (ref >60)
GFR calc non Af Amer: 18 mL/min — ABNORMAL LOW (ref >60)
GLUCOSE: 122 mg/dL — AB (ref 65–99)
PHOSPHORUS: 3 mg/dL (ref 2.5–4.6)
POTASSIUM: 4.1 mmol/L (ref 3.5–5.1)
Sodium: 137 mmol/L (ref 135–145)

## 2014-10-22 LAB — CBC
HEMATOCRIT: 30.4 % — AB (ref 39.0–52.0)
Hemoglobin: 9.5 g/dL — ABNORMAL LOW (ref 13.0–17.0)
MCH: 27.7 pg (ref 26.0–34.0)
MCHC: 31.3 g/dL (ref 30.0–36.0)
MCV: 88.6 fL (ref 78.0–100.0)
Platelets: 294 10*3/uL (ref 150–400)
RBC: 3.43 MIL/uL — ABNORMAL LOW (ref 4.22–5.81)
RDW: 18.1 % — AB (ref 11.5–15.5)
WBC: 8.7 10*3/uL (ref 4.0–10.5)

## 2014-10-22 LAB — CULTURE, BLOOD (ROUTINE X 2)
Culture: NO GROWTH
Culture: NO GROWTH

## 2014-10-22 LAB — HEPARIN LEVEL (UNFRACTIONATED): HEPARIN UNFRACTIONATED: 0.41 [IU]/mL (ref 0.30–0.70)

## 2014-10-22 MED ORDER — LIDOCAINE HCL 1 % IJ SOLN
INTRAMUSCULAR | Status: AC
  Filled 2014-10-22: qty 20

## 2014-10-22 MED ORDER — FENTANYL CITRATE (PF) 100 MCG/2ML IJ SOLN
INTRAMUSCULAR | Status: AC
  Filled 2014-10-22: qty 2

## 2014-10-22 MED ORDER — MIDAZOLAM HCL 2 MG/2ML IJ SOLN
INTRAMUSCULAR | Status: AC
  Filled 2014-10-22: qty 2

## 2014-10-22 NOTE — Consult Note (Signed)
Chief Complaint: AMS cholecystitis  Referring Physician(s): Hijazi  History of Present Illness: Harold Mayo is a 54 y.o. male   Pt presented to [facility] 09/10/14 with abd pain; fever Underwent laparotomy with sigmoid colon resection and abscess drain Complicated with respiratory failure; trach/vent and admitted to [facility] last week Developed new onset altered mental status Work up revealed gallstone and cholecystitis Pt  Is afeb and wbc wnl Rec: HIDA Performed 6/15 and shows No visualization of GB Imaging all reviewed with Dr Jodell Cipro procedure of percutaneous cholecystostomy drain placement Also need surgical consult per Dr Deanne Coffer for plan of management of cholecystitis   Past Medical History  Diagnosis Date  . ESRD (end stage renal disease)   . GERD (gastroesophageal reflux disease)   . Hypertension   . Atrial flutter   . Diverticulosis   . Diabetes mellitus, type 2   . Hyperparathyroidism     Past Surgical History  Procedure Laterality Date  . Colectomy    . Tracheostomy    . Parathyroidectomy    . Thyroidectomy, partial      Allergies: Review of patient's allergies indicates not on file.  Medications: Prior to Admission medications   Not on File     History reviewed. No pertinent family history.  History   Social History  . Marital Status: Single    Spouse Name: N/A  . Number of Children: N/A  . Years of Education: N/A   Social History Main Topics  . Smoking status: Not on file  . Smokeless tobacco: Not on file  . Alcohol Use: Not on file  . Drug Use: Not on file  . Sexual Activity: Not on file   Other Topics Concern  . None   Social History Narrative    Review of Systems: A 12 point ROS discussed and pertinent positives are indicated in the HPI above.  All other systems are negative.  Review of Systems  Constitutional: Positive for diaphoresis, activity change, appetite change and fatigue. Negative for fever.    Gastrointestinal: Negative for abdominal pain.  Neurological: Positive for weakness.    Vital Signs: BP 128/87 mmHg  Physical Exam  Cardiovascular: Normal rate and regular rhythm.   Pulmonary/Chest: He has wheezes.  Vent/trach  Abdominal: Soft. There is no tenderness.  Musculoskeletal: Normal range of motion.  Neurological:  Alert; non responsive  Skin: Skin is warm.  Psychiatric:  Consented sister in law via phone  Nursing note and vitals reviewed.   Mallampati Score:  MD Evaluation Airway: Other (comments) Airway comments: trach Heart: WNL Abdomen: WNL Chest/ Lungs: WNL ASA  Classification: 3 Mallampati/Airway Score: Three  Imaging: Dg Abd 1 View  10/21/2014   CLINICAL DATA:  Ileus.  EXAM: ABDOMEN - 1 VIEW  COMPARISON:  October 19, 2014.  FINDINGS: No abnormal bowel dilatation is noted. Phleboliths are noted in the pelvis. Nasogastric tube tip is seen in expected position of proximal stomach. Probable gastrostomy tube seen in the left side of the abdomen.  IMPRESSION: No definite evidence of bowel obstruction or ileus.   Electronically Signed   By: Lupita Raider, M.D.   On: 10/21/2014 09:08   Ct Head Wo Contrast  10/16/2014   CLINICAL DATA:  Altered mental status  EXAM: CT HEAD WITHOUT CONTRAST  TECHNIQUE: Contiguous axial images were obtained from the base of the skull through the vertex without intravenous contrast.  COMPARISON:  None  FINDINGS: Generalized atrophy.  Normal ventricular morphology.  No midline shift or  mass effect.  Small vessel chronic ischemic changes of deep cerebral white matter.  No intracranial hemorrhage, mass lesion, or acute infarction.  Visualized paranasal sinuses and mastoid air cells clear.  Bones unremarkable.  Atherosclerotic calcifications at skullbase.  IMPRESSION: Atrophy with minimal small vessel chronic ischemic changes of deep cerebral white matter.  No acute intracranial abnormalities.   Electronically Signed   By: Ulyses Southward M.D.   On:  10/16/2014 13:47   Nm Hepatobiliary Liver Func  10/21/2014   CLINICAL DATA:  Cholelithiasis.  Possible cholecystitis.  EXAM: NUCLEAR MEDICINE HEPATOBILIARY IMAGING  TECHNIQUE: Sequential images of the abdomen were obtained out to 60 minutes following intravenous administration of radiopharmaceutical.  RADIOPHARMACEUTICALS:  5.0 mCi Tc-64m Choletec IV  COMPARISON:  Ultrasound 10/18/2014.  FINDINGS: Gallbladder does not visualize at 60 min. Very questionable faint visualization at 2 hr. No focal hepatic abnormality. No evidence of biliary obstruction. Bowel as visualized.  IMPRESSION: Nonvisualization of the gallbladder 60 min. Very questionable faint visualization at 2 hr. Findings suggests cholecystitis, possibly chronic. Further delayed imaging can be obtained if needed .   Electronically Signed   By: Maisie Fus  Register   On: 10/21/2014 16:45   US Abdomen Complete  10/18/2014   CLINICAL DATA:  Right upper quadrant pain  EXAM: ULTRASOUND ABDOMEN COMPLETE  COMPARISON:  10/16/2014  FINDINGS: Gallbladder: Decompressed with evidence of a gallstone within the neck of the gallbladder. The wall is thickened at 7.6 mm although portion of this is likely related to underlying ascites as well as the decompressed state  Common bile duct: Diameter: 6.9 mm.  Liver: Heterogeneous with some nodular component consistent with underlying cirrhosis.  IVC: No abnormality visualized.  Pancreas: Not well visualized.  Spleen: Size and appearance within normal limits.  Right Kidney: Length: 9.6 cm. Multiple cysts are seen. The largest of these measures 2.2 cm.  Left Kidney: Length: 10.1 cm.  1.4 cm cyst in the midportion.  Abdominal aorta: No aneurysm visualized.  Other findings: Ascites is noted  IMPRESSION: Cholelithiasis. Gallbladder wall thickening is noted although a portion of this is likely related to a decompressed state as well as ascites.  Ascites predominately surrounding the liver.  Changes of cirrhosis of the liver.  Renal  cysts.   Electronically Signed   By: Alcide Clever M.D.   On: 10/18/2014 10:47   Ct Abdomen Pelvis W Contrast  10/16/2014   CLINICAL DATA:  Abdominal distention.  EXAM: CT ABDOMEN AND PELVIS WITH CONTRAST  TECHNIQUE: Multidetector CT imaging of the abdomen and pelvis was performed using the standard protocol following bolus administration of intravenous contrast.  CONTRAST:  OMNIPAQUE IOHEXOL 300 MG/ML  SOLN  COMPARISON:  None.  FINDINGS: Lower chest: There are moderate bilateral pleural effusions. Compressive atelectasis in the lower lobes bilaterally. There is cardiomegaly.  Hepatobiliary: There is enlargement of the left hepatic lobe and subtle nodular appearance to the contours suggesting cirrhosis. Gallstone noted within the gallbladder neck. Gallbladder is mildly contracted. Gallbladder wall appears mildly thickened which may be related to liver disease.  Pancreas: No focal abnormality or ductal dilatation.  Spleen: No focal abnormality.  Normal size.  Adrenals/Urinary Tract: Adrenal glands unremarkable. Kidneys are atrophic with multiple bilateral renal cysts. No hydronephrosis. Urinary bladder decompressed.  Stomach/Bowel: Left lower quadrant ostomy noted. Stomach and small bowel grossly unremarkable.  Vascular/Lymphatic: No retroperitoneal or mesenteric adenopathy. Aorta normal caliber.  Reproductive: No mass or other significant abnormality.  Other: Fluid noted along the low left lobe of the liver. This  appears loculated along the left lobe of the liver  Musculoskeletal: No focal bone lesion or acute bony abnormality.  IMPRESSION: Moderate bilateral pleural effusions with compressive atelectasis in the lower lobes.  Loculated fluid around the left lobe of the liver. This has mass effect on the stomach.  Appearance of the liver suggest cirrhosis.  Gallstone noted within the gallbladder neck with mild apparent gallbladder wall thickening which could be related to cholecystitis or liver disease.  Recommend clinical correlation.   Electronically Signed   By: Charlett Nose M.D.   On: 10/16/2014 14:07   Dg Chest Port 1 View  10/18/2014   CLINICAL DATA:  54 year old male with history of pleural effusions status post left thoracentesis.  EXAM: PORTABLE CHEST - 1 VIEW  COMPARISON:  Chest x-ray 10/16/2014.  FINDINGS: There is a right upper extremity PICC with tip terminating in the distal superior vena cava. Tracheostomy tube in position with tip 4.8 cm above the carina. A nasogastric tube is seen extending into the stomach, however, the tip of the nasogastric tube extends below the lower margin of the image. Multifocal airspace disease throughout the mid to lower lungs bilaterally again noted. Decreased size of what is now a small to moderate left pleural effusion. No pneumothorax. No right pleural effusion. Cephalization of the pulmonary vasculature. Mild diffuse indistinctness of the interstitial markings. Mild cardiomegaly. Upper mediastinal contours are similar to the prior examination, widened slightly likely related to lordotic positioning, supine AP portable technique and slight low lung volumes.  IMPRESSION: 1. Decreased size of what is now a small to moderate-sized left-sided pleural effusion following thoracentesis. No pneumothorax. 2. Cardiomegaly with evidence of mild pulmonary edema. 3. Likely additional areas of atelectasis in the lower lungs bilaterally.   Electronically Signed   By: Trudie Reed M.D.   On: 10/18/2014 10:24   Dg Chest Port 1 View  10/16/2014   CLINICAL DATA:  Respiratory failure  EXAM: PORTABLE CHEST - 1 VIEW  COMPARISON:  10/15/2014  FINDINGS: There is a tracheostomy. Nasogastric tube extends into the stomach. Right upper extremity PICC line extends into the SVC.  There is a left pleural effusion. There is no interval change in the central and basilar airspace opacities bilaterally.  IMPRESSION: Support equipment appears satisfactorily positioned.  No significant interval  change in the bilateral airspace opacities.  Persistent left pleural effusion   Electronically Signed   By: Ellery Plunk M.D.   On: 10/16/2014 05:57   Dg Chest Port 1 View  10/15/2014   CLINICAL DATA:  Respiratory failure .  EXAM: PORTABLE CHEST - 1 VIEW  COMPARISON:  None.  FINDINGS: Tracheostomy tube and NG tube in good anatomic position. Right PICC line noted with tip at cavoatrial junction. Cardiomegaly. Bilateral pulmonary alveolar infiltrates and pleural effusions. These findings are most likely secondary to congestive heart failure with pulmonary edema. Bilateral pneumonia cannot be excluded. No pneumothorax.  IMPRESSION: 1. Tracheostomy tube, NG tube, right PICC line in good anatomic position. 2. Congestive heart failure bilateral pulmonary edema and bilateral effusions. Associated pneumonia cannot be excluded.   Electronically Signed   By: Maisie Fus  Register   On: 10/15/2014 07:07   Dg Abd Portable 1v  10/19/2014   CLINICAL DATA:  Abdominal distention today.  EXAM: PORTABLE ABDOMEN - 1 VIEW  COMPARISON:  Plain films the abdomen 10/15/2014 and CT abdomen and pelvis 10/16/2014.  FINDINGS: Mild gaseous distention of small and large bowel is identified. No evidence of free intraperitoneal air is seen on the  single view. No abnormal abdominal calcification or focal bony abnormality is identified.  IMPRESSION: Findings most compatible with mild ileus.   Electronically Signed   By: Drusilla Kanner M.D.   On: 10/19/2014 17:15   Dg Abd Portable 1v  10/15/2014   CLINICAL DATA:  NG tube placement.  EXAM: PORTABLE ABDOMEN - 1 VIEW  COMPARISON:  10/14/2014  FINDINGS: NG tube tip is in the body of the stomach. Bowel gas pattern is normal. Ostomy in the left lower quadrant. No acute osseous abnormality. Cardiomegaly. Left pleural effusion.  IMPRESSION: NG tube tip in the body of the stomach.   Electronically Signed   By: Francene Boyers M.D.   On: 10/15/2014 10:36   Dg Abd Portable 1v  10/14/2014   CLINICAL  DATA:  Nasogastric tube placement.  Initial encounter.  EXAM: PORTABLE ABDOMEN - 1 VIEW  COMPARISON:  None.  FINDINGS: Enteric tube is present with the tip in the gastric fundus. Calcifications project over the renal shadows bilaterally, compatible with collecting system calculi. Visible bowel gas pattern appears normal.  IMPRESSION: Enteric tube tip in the mid gastric fundus.   Electronically Signed   By: Andreas Newport M.D.   On: 10/14/2014 15:37   US Thoracentesis Asp Pleural Space W/img Guide  10/18/2014   CLINICAL DATA:  Respiratory failure. Left-sided pleural effusion. Request diagnostic and therapeutic thoracentesis.  EXAM: ULTRASOUND GUIDED LEFT THORACENTESIS  COMPARISON:  None.  PROCEDURE: An ultrasound guided thoracentesis was thoroughly discussed with the patient and questions answered. The benefits, risks, alternatives and complications were also discussed. The patient understands and wishes to proceed with the procedure. Written consent was obtained.  Ultrasound was performed to localize and mark an adequate pocket of fluid in the left chest. The area was then prepped and draped in the normal sterile fashion. 1% Lidocaine was used for local anesthesia. Under ultrasound guidance a 19 gauge Yueh catheter was introduced. Thoracentesis was performed. The catheter was removed and a dressing applied.  COMPLICATIONS: None immediate.  FINDINGS: A total of approximately 550 mL of clear, dark yellow fluid was removed. A fluid sample wassent for laboratory analysis.  IMPRESSION: Successful ultrasound guided left thoracentesis yielding 550 mL of pleural fluid.  Read by: Brayton El PA-C   Electronically Signed   By: Malachy Moan M.D.   On: 10/18/2014 10:16    Labs:  CBC:  Recent Labs  10/19/14 0219 10/20/14 0630 10/21/14 0401 10/22/14 0710  WBC 7.2 7.0 6.2 8.7  HGB 8.5* 10.2* 8.9* 9.5*  HCT 27.7* 33.4* 29.5* 30.4*  PLT 303 296 306 294    COAGS:  Recent Labs  10/16/14 0500  10/17/14 0533  INR 1.30 1.28  APTT 47*  --     BMP:  Recent Labs  10/20/14 0630 10/21/14 0401 10/21/14 1720 10/22/14 0710  NA 141 137  137 139 137  137  K 4.0 3.1*  3.1* 4.1 4.0  4.1  CL 97* 95*  95* 97* 95*  95*  CO2 GLUCOSE 67 104*  106* 127* 125*  122*  BUN 23* CALCIUM 10.1 10.3  10.2 9.7 9.7  9.6  CREATININE 5.27* 4.04*  4.04* 2.87* 3.60*  3.56*  GFRNONAA 11* 16*  16* 24* 18*  18*  GFRAA 13* 18*  18* 27* 21*  21*    LIVER FUNCTION TESTS:  Recent Labs  10/17/14 0533  10/20/14 0630 10/21/14 0401 10/21/14  1720 10/22/14 0710  BILITOT 2.2*  --   --  2.5* 2.8* 2.7*  AST 25  --   --  22 32 29  ALT 12*  --   --  11* 11* 13*  ALKPHOS 160*  --   --  119 120 112  PROT 8.2*  --   --  8.4* 8.9* 8.7*  ALBUMIN 3.5  < > 3.7 3.7  3.6 3.9 3.7  3.7  < > = values in this interval not displayed.  TUMOR MARKERS: No results for input(s): AFPTM, CEA, CA199, CHROMGRNA in the last 8760 hours.  Assessment and Plan:  New AMS Cholecystitis on work up HIDA shows NO vis of GB Now scheduled for percutaneous cholecystostomy drain placement in IR 6/17 Vanco on call Hep off 3 hrs prior Risks and Benefits discussed with the patient's family including, but not limited to bleeding, infection, gallbladder perforation, bile leak, sepsis or even death. All of the patient's family questions were answered, family is agreeable to proceed. Consent signed and in chart.  Placed order for surgical consult for plan/management of cholecystitis  Thank you for this interesting consult.  I greatly enjoyed meeting Harold Mayo and look forward to participating in their care.  Signed: Gianella Chismar A 10/22/2014, 1:02 PM   I spent a total of 40 Minutes    in face to face in clinical consultation, greater than 50% of which was counseling/coordinating care for perc chole

## 2014-10-22 NOTE — Progress Notes (Signed)
Name: Harold Mayo MRN: [id number] DOB: 06-05-1960    ADMISSION DATE:  10/14/2014 CONSULTATION DATE: 10/15/2014  REFERRING MD :  [facility]  CHIEF COMPLAINT:  Resp failure  BRIEF PATIENT DESCRIPTION:  54 yo male admitted to [facility] 09/10/14 with abdominal pain.  Had laparotomy, colon resection and abscess drainage.  Developed respiratory failure with failure to wean from vent and required tracheostomy 6/01.  Transferred to [facility] 6/08 for wound care, rehab, and vent weaning.  SUBJECTIVE:  Weaning on ATC, confused , lying sideways in bed  VITAL SIGNS: Afebrile RR 24-28/m satn 95% on ATC  PHYSICAL EXAMINATION: General: trach Neuro:   rass 0, does not follow simple commands HEENT: Tracheostomy 8 XLT Cardiovascular: HSR RRR Lungs: redcued Abdomen:  Open wound with mid line vac, colostomy Musculoskeletal:  intact Skin: warm and dry    CMP Latest Ref Rng 10/22/2014 10/22/2014 10/21/2014  Glucose 65 - 99 mg/dL 235(T) 732(K) 025(K)  BUN 6 - 20 mg/dL 13 13 8   Creatinine 0.61 - 1.24 mg/dL 2.70(W) 2.37(S) 2.83(T)  Sodium 135 - 145 mmol/L 137 137 139  Potassium 3.5 - 5.1 mmol/L 4.1 4.0 4.1  Chloride 101 - 111 mmol/L 95(L) 95(L) 97(L)  CO2 22 - 32 mmol/L 22 23 24   Calcium 8.9 - 10.3 mg/dL 9.6 9.7 9.7  Total Protein 6.5 - 8.1 g/dL - 8.7(H) 8.9(H)  Total Bilirubin 0.3 - 1.2 mg/dL - 2.7(H) 2.8(H)  Alkaline Phos 38 - 126 U/L - 112 120  AST 15 - 41 U/L - 29 32  ALT 17 - 63 U/L - 13(L) 11(L)    CBC Latest Ref Rng 10/22/2014 10/21/2014 10/20/2014  WBC 4.0 - 10.5 K/uL PENDING 6.2 7.0  Hemoglobin 13.0 - 17.0 g/dL 5.1(V) 8.9(L) 10.2(L)  Hematocrit 39.0 - 52.0 % 30.4(L) 29.5(L) 33.4(L)  Platelets 150 - 400 K/uL 294 306 296    ABG    Component Value Date/Time   PHART 7.345* 10/16/2014 0500   PCO2ART 45.7* 10/16/2014 0500   PO2ART 84.1 10/16/2014 0500   HCO3 24.3* 10/16/2014 0500   TCO2 25.7 10/16/2014 0500   ACIDBASEDEF 0.6 10/16/2014 0500   O2SAT 95.1 10/16/2014 0500     Dg Abd 1 View  10/21/2014   CLINICAL DATA:  Ileus.  EXAM: ABDOMEN - 1 VIEW  COMPARISON:  October 19, 2014.  FINDINGS: No abnormal bowel dilatation is noted. Phleboliths are noted in the pelvis. Nasogastric tube tip is seen in expected position of proximal stomach. Probable gastrostomy tube seen in the left side of the abdomen.  IMPRESSION: No definite evidence of bowel obstruction or ileus.   Electronically Signed   By: Lupita Raider, M.D.   On: 10/21/2014 09:08   Nm Hepatobiliary Liver Func  10/21/2014   CLINICAL DATA:  Cholelithiasis.  Possible cholecystitis.  EXAM: NUCLEAR MEDICINE HEPATOBILIARY IMAGING  TECHNIQUE: Sequential images of the abdomen were obtained out to 60 minutes following intravenous administration of radiopharmaceutical.  RADIOPHARMACEUTICALS:  5.0 mCi Tc-47m Choletec IV  COMPARISON:  Ultrasound 10/18/2014.  FINDINGS: Gallbladder does not visualize at 60 min. Very questionable faint visualization at 2 hr. No focal hepatic abnormality. No evidence of biliary obstruction. Bowel as visualized.  IMPRESSION: Nonvisualization of the gallbladder 60 min. Very questionable faint visualization at 2 hr. Findings suggests cholecystitis, possibly chronic. Further delayed imaging can be obtained if needed .   Electronically Signed   By: Maisie Fus  Register   On: 10/21/2014 16:45   pcxr 6/10 left effusion  ASSESSMENT   Acute  respiratory failure after laparotomy. Failure to wean from ventilatory s/p tracheostomy. Hx of tobacco abuse. bilat effusions - lt thora 6/12 - lymphocytic exudate Plan: -advance ATC as tolerated, did 12h on 6/15   Sigmoid colectomy, colostomy, Hartmann pouch with open wound and wound vac. Plan: - wound care, nutrition per primary team -vigilance for new infections , while onTNA  Hx of ESRD, DM type II, HTN, GERD, A flutter. S/p partial thyroidectomy, parathyroidectomy 10/07/14. Plan: - per primary team, cards following a flutter - for maximizing neg balance,  see pulm  Delerium -assess qtc,  IV haldol prn  and taper klonopin to off  -ct seroquel for now  Cyril Mourning MD. FCCP. [facility] Pager [phone] If no response call [phone]

## 2014-10-23 ENCOUNTER — Other Ambulatory Visit (HOSPITAL_COMMUNITY): Payer: Self-pay

## 2014-10-23 LAB — RENAL FUNCTION PANEL
Albumin: 3.6 g/dL (ref 3.5–5.0)
Anion gap: 14 (ref 5–15)
BUN: 16 mg/dL (ref 6–20)
CO2: 27 mmol/L (ref 22–32)
Calcium: 9.8 mg/dL (ref 8.9–10.3)
Chloride: 96 mmol/L — ABNORMAL LOW (ref 101–111)
Creatinine, Ser: 4 mg/dL — ABNORMAL HIGH (ref 0.61–1.24)
GFR calc Af Amer: 18 mL/min — ABNORMAL LOW (ref >60)
GFR calc non Af Amer: 16 mL/min — ABNORMAL LOW (ref >60)
GLUCOSE: 124 mg/dL — AB (ref 65–99)
POTASSIUM: 3.4 mmol/L — AB (ref 3.5–5.1)
Phosphorus: 2.7 mg/dL (ref 2.5–4.6)
SODIUM: 137 mmol/L (ref 135–145)

## 2014-10-23 LAB — CULTURE, BLOOD (ROUTINE X 2)
Culture: NO GROWTH
Culture: NO GROWTH

## 2014-10-23 LAB — CBC
HEMATOCRIT: 28.3 % — AB (ref 39.0–52.0)
Hemoglobin: 8.6 g/dL — ABNORMAL LOW (ref 13.0–17.0)
MCH: 27.3 pg (ref 26.0–34.0)
MCHC: 30.4 g/dL (ref 30.0–36.0)
MCV: 89.8 fL (ref 78.0–100.0)
PLATELETS: 271 10*3/uL (ref 150–400)
RBC: 3.15 MIL/uL — ABNORMAL LOW (ref 4.22–5.81)
RDW: 18.2 % — ABNORMAL HIGH (ref 11.5–15.5)
WBC: 8.1 10*3/uL (ref 4.0–10.5)

## 2014-10-23 LAB — VANCOMYCIN, TROUGH: VANCOMYCIN TR: 20 ug/mL (ref 10.0–20.0)

## 2014-10-23 LAB — APTT: aPTT: 195 seconds — ABNORMAL HIGH (ref 24–37)

## 2014-10-23 LAB — HEPARIN LEVEL (UNFRACTIONATED): Heparin Unfractionated: 0.33 IU/mL (ref 0.30–0.70)

## 2014-10-23 LAB — PROTIME-INR
INR: 1.71 — ABNORMAL HIGH (ref 0.00–1.49)
PROTHROMBIN TIME: 20.1 s — AB (ref 11.6–15.2)

## 2014-10-23 LAB — GRAM STAIN: Gram Stain: NONE SEEN

## 2014-10-23 NOTE — Sedation Documentation (Signed)
Potassium 10 meq in 50 cc bag started by RN from 5700. K 3.4

## 2014-10-23 NOTE — Sedation Documentation (Signed)
No further dysrhythmias noted

## 2014-10-23 NOTE — Procedures (Signed)
10 Fr cholecystostomy No comp/EBL 

## 2014-10-23 NOTE — Progress Notes (Signed)
Came to see patient again today but he is currently down for cholecystostomy drain placement.  Pt due for Hd today and orders have been prepared, will prepare orders again for Monday as well.  Pt had daily dialysis this past week.

## 2014-10-23 NOTE — Sedation Documentation (Signed)
No sedation given due to some dysrhythmias.  K 3.4 RN from 5700 coming down to give IV potassium. Had bigeminal PVC's with only PVC's perfusing. Couplets then sinus tach 101. Lasted for about 1 minute.

## 2014-10-24 ENCOUNTER — Other Ambulatory Visit (HOSPITAL_COMMUNITY): Payer: Self-pay

## 2014-10-24 NOTE — Progress Notes (Signed)
Referring Physician(s): [facility]  Subjective: Patient with trach without verbal response  Allergies: Review of patient's allergies indicates not on file.  Medications: Prior to Admission medications   Not on File   Vital Signs: BP 174/94 mmHg  Pulse 92  Resp 29  SpO2 99%  Physical Exam General: Vent/tracheostomy intact Abd: Distended, RUQ perc chole drain intact-bloody output in bag <10cc  Imaging: Dg Abd 1 View  10/21/2014   CLINICAL DATA:  Ileus.  EXAM: ABDOMEN - 1 VIEW  COMPARISON:  October 19, 2014.  FINDINGS: No abnormal bowel dilatation is noted. Phleboliths are noted in the pelvis. Nasogastric tube tip is seen in expected position of proximal stomach. Probable gastrostomy tube seen in the left side of the abdomen.  IMPRESSION: No definite evidence of bowel obstruction or ileus.   Electronically Signed   By: Lupita Raider, M.D.   On: 10/21/2014 09:08   Nm Hepatobiliary Liver Func  10/21/2014   CLINICAL DATA:  Cholelithiasis.  Possible cholecystitis.  EXAM: NUCLEAR MEDICINE HEPATOBILIARY IMAGING  TECHNIQUE: Sequential images of the abdomen were obtained out to 60 minutes following intravenous administration of radiopharmaceutical.  RADIOPHARMACEUTICALS:  5.0 mCi Tc-66m Choletec IV  COMPARISON:  Ultrasound 10/18/2014.  FINDINGS: Gallbladder does not visualize at 60 min. Very questionable faint visualization at 2 hr. No focal hepatic abnormality. No evidence of biliary obstruction. Bowel as visualized.  IMPRESSION: Nonvisualization of the gallbladder 60 min. Very questionable faint visualization at 2 hr. Findings suggests cholecystitis, possibly chronic. Further delayed imaging can be obtained if needed .   Electronically Signed   By: Maisie Fus  Register   On: 10/21/2014 16:45   Ir Perc Cholecystostomy  10/23/2014   CLINICAL DATA:  Cholecystitis  EXAM: CHOLECYSTOSTOMY  FLUOROSCOPY TIME:  2 minutes and 42 seconds  MEDICATIONS AND MEDICAL HISTORY: None  ANESTHESIA/SEDATION: None  CONTRAST:   5 cc Omnipaque 300  PROCEDURE: The procedure, risks, benefits, and alternatives were explained to the patient. Questions regarding the procedure were encouraged and answered. The patient understands and consents to the procedure.  The right upper quadrant was prepped with Betadine in a sterile fashion, and a sterile drape was applied covering the operative field. A sterile gown and sterile gloves were used for the procedure.  A 21 gauge needle was inserted into the gallbladder lumen under sonographic guidance and via transhepatic approach. It was removed over a 018 wire, which was upsized to a 3-J. A 10-French drain was advanced over the wire and coiled in the gallbladder lumen. It was sewn to the skin after being string fixed.  FINDINGS: Contrast fills the gallbladder lumen. After an adequate appeared of waiting, the cystic duct remained occluded. After manipulation of the wire which traversed the cystic duct, contrast did slowly pass through the cystic duct into the common bile duct. The cystic duct is likely partially occluded or is fully occluded blood was temporarily opened to allow passage of a small amount of contrast.  COMPLICATIONS: None  IMPRESSION: Successful cholecystostomy. This needs to remain in place at least 6 weeks.   Electronically Signed   By: Jolaine Click M.D.   On: 10/23/2014 13:22   Dg Abd Portable 1v  10/23/2014   CLINICAL DATA:  Ileus.  EXAM: PORTABLE ABDOMEN - 1 VIEW  COMPARISON:  10/21/2014.  FINDINGS: NG tube and gastrostomy tube in stable position. Multiple distended loops of small bowel noted. Colon is nondistended. Small bowel obstruction and/or adynamic ileus could present this fashion. Ostomy site noted over the left  lower quadrant. Vascular calcification. No acute bony abnormality.  IMPRESSION: 1.  NG tube and gastrostomy tube in stable position.  2. Interim distention of multiple loops of small bowel. The colon is nondistended. No free air. Small bowel obstruction and/or  adynamic ileus could present this fashion. Follow-up abdominal series suggested to demonstrate clearing .   Electronically Signed   By: Maisie Fus  Register   On: 10/23/2014 08:21    Labs:  CBC:  Recent Labs  10/20/14 0630 10/21/14 0401 10/22/14 0710 10/23/14 0517  WBC 7.0 6.2 8.7 8.1  HGB 10.2* 8.9* 9.5* 8.6*  HCT 33.4* 29.5* 30.4* 28.3*  PLT 296 306 294 271    COAGS:  Recent Labs  10/16/14 0500 10/17/14 0533 10/23/14 0517  INR 1.30 1.28 1.71*  APTT 47*  --  195*    BMP:  Recent Labs  10/21/14 0401 10/21/14 1720 10/22/14 0710 10/23/14 0517  NA 137  137 139 137  137 137  K 3.1*  3.1* 4.1 4.0  4.1 3.4*  CL 95*  95* 97* 95*  95* 96*  CO2 GLUCOSE 104*  106* 127* 125*  122* 124*  BUN CALCIUM 10.3  10.2 9.7 9.7  9.6 9.8  CREATININE 4.04*  4.04* 2.87* 3.60*  3.56* 4.00*  GFRNONAA 16*  16* 24* 18*  18* 16*  GFRAA 18*  18* 27* 21*  21* 18*    LIVER FUNCTION TESTS:  Recent Labs  10/17/14 0533  10/21/14 0401 10/21/14 1720 10/22/14 0710 10/23/14 0517  BILITOT 2.2*  --  2.5* 2.8* 2.7*  --   AST 25  --  22 32 29  --   ALT 12*  --  11* 11* 13*  --   ALKPHOS 160*  --  119 120 112  --   PROT 8.2*  --  8.4* 8.9* 8.7*  --   ALBUMIN 3.5  < > 3.7  3.6 3.9 3.7  3.7 3.6  < > = values in this interval not displayed.  Assessment and Plan: Cholecystitis, HIDA 6/15 without visualization of GB S/p perc cholecystostomy drain placed 6/17  No new labs today, afebrile, 24hr output 50cc-bloody Recommend surgical consult for management of cholecystitis  Recommend flushing RUQ catheter BID 10ml sterile saline, monitor and record daily output Plans per Primary   Signed: Berneta Levins 10/24/2014, 9:22 AM   I spent a total of 15 Minutes in face to face in clinical consultation/evaluation, greater than 50% of which was counseling/coordinating care for acute cholecystitis

## 2014-10-25 ENCOUNTER — Other Ambulatory Visit (HOSPITAL_COMMUNITY): Payer: Self-pay

## 2014-10-25 LAB — COMPREHENSIVE METABOLIC PANEL
ALT: 12 U/L — AB (ref 17–63)
AST: 28 U/L (ref 15–41)
Albumin: 3.2 g/dL — ABNORMAL LOW (ref 3.5–5.0)
Alkaline Phosphatase: 94 U/L (ref 38–126)
Anion gap: 10 (ref 5–15)
BILIRUBIN TOTAL: 2.9 mg/dL — AB (ref 0.3–1.2)
BUN: 33 mg/dL — AB (ref 6–20)
CHLORIDE: 99 mmol/L — AB (ref 101–111)
CO2: 27 mmol/L (ref 22–32)
Calcium: 9.9 mg/dL (ref 8.9–10.3)
Creatinine, Ser: 5.54 mg/dL — ABNORMAL HIGH (ref 0.61–1.24)
GFR calc Af Amer: 12 mL/min — ABNORMAL LOW (ref >60)
GFR, EST NON AFRICAN AMERICAN: 11 mL/min — AB (ref >60)
GLUCOSE: 113 mg/dL — AB (ref 65–99)
POTASSIUM: 3.9 mmol/L (ref 3.5–5.1)
SODIUM: 136 mmol/L (ref 135–145)
Total Protein: 7.8 g/dL (ref 6.5–8.1)

## 2014-10-25 LAB — MAGNESIUM: MAGNESIUM: 2 mg/dL (ref 1.7–2.4)

## 2014-10-26 LAB — COMPREHENSIVE METABOLIC PANEL
ALT: 11 U/L — ABNORMAL LOW (ref 17–63)
ANION GAP: 15 (ref 5–15)
AST: 27 U/L (ref 15–41)
Albumin: 3.6 g/dL (ref 3.5–5.0)
Alkaline Phosphatase: 107 U/L (ref 38–126)
BILIRUBIN TOTAL: 3 mg/dL — AB (ref 0.3–1.2)
BUN: 54 mg/dL — AB (ref 6–20)
CO2: 21 mmol/L — ABNORMAL LOW (ref 22–32)
CREATININE: 6.19 mg/dL — AB (ref 0.61–1.24)
Calcium: 9.8 mg/dL (ref 8.9–10.3)
Chloride: 95 mmol/L — ABNORMAL LOW (ref 101–111)
GFR calc Af Amer: 11 mL/min — ABNORMAL LOW (ref >60)
GFR calc non Af Amer: 9 mL/min — ABNORMAL LOW (ref >60)
Glucose, Bld: 163 mg/dL — ABNORMAL HIGH (ref 65–99)
Potassium: 4.7 mmol/L (ref 3.5–5.1)
Sodium: 131 mmol/L — ABNORMAL LOW (ref 135–145)
TOTAL PROTEIN: 8.9 g/dL — AB (ref 6.5–8.1)

## 2014-10-26 LAB — CBC
HEMATOCRIT: 33.9 % — AB (ref 39.0–52.0)
HEMOGLOBIN: 10.5 g/dL — AB (ref 13.0–17.0)
MCH: 27.3 pg (ref 26.0–34.0)
MCHC: 31 g/dL (ref 30.0–36.0)
MCV: 88.1 fL (ref 78.0–100.0)
Platelets: 301 10*3/uL (ref 150–400)
RBC: 3.85 MIL/uL — ABNORMAL LOW (ref 4.22–5.81)
RDW: 18.7 % — AB (ref 11.5–15.5)
WBC: 7 10*3/uL (ref 4.0–10.5)

## 2014-10-26 LAB — TRIGLYCERIDES: Triglycerides: 90 mg/dL (ref <150)

## 2014-10-26 LAB — PHOSPHORUS: Phosphorus: 5.6 mg/dL — ABNORMAL HIGH (ref 2.5–4.6)

## 2014-10-26 LAB — MAGNESIUM: MAGNESIUM: 2 mg/dL (ref 1.7–2.4)

## 2014-10-26 NOTE — Progress Notes (Signed)
Subjective:  Dialyzed earlier today 3500 cc removed   Objective:  Vital signs in last 24 hours:    97.8  75  30 140/100  Weight change:  There were no vitals filed for this visit.   Physical Exam: General: On the vent  HEENT [place]/AT OM moist, NG tube in place  Neck Trach in place  Pulm/lungs Bilateral rhonchi, trach trial at present  CVS/Heart S1S2 irregular  Abdomen:  Distended, decreased BS, wound vac in place, colosotomy in place, biliary drain  Extremities: 1+ pitting edema  Neurologic: Lethargic but arousable, follows simple commands  Skin: No acute rashes  Access: Left radiocephalic AVF       Basic Metabolic Panel:  Recent Labs Lab 10/21/14 0401 10/21/14 1720 10/22/14 0710 10/23/14 0517 10/25/14 0630 10/26/14 0644  NA 137  137 139 137  137 137 136 131*  K 3.1*  3.1* 4.1 4.0  4.1 3.4* 3.9 4.7  CL 95*  95* 97* 95*  95* 96* 99* 95*  CO2 21*  GLUCOSE 104*  106* 127* 125*  122* 124* 113* 163*  BUN 33* 54*  CREATININE 4.04*  4.04* 2.87* 3.60*  3.56* 4.00* 5.54* 6.19*  CALCIUM 10.3  10.2 9.7 9.7  9.6 9.8 9.9 9.8  MG 2.2 2.2  --   --  2.0 2.0  PHOS 3.1  3.1 2.8 3.0 2.7  --  5.6*     CBC:  Recent Labs Lab 10/20/14 0630 10/21/14 0401 10/22/14 0710 10/23/14 0517 10/26/14 0644  WBC 7.0 6.2 8.7 8.1 7.0  NEUTROABS  --  2.3  --   --   --   HGB 10.2* 8.9* 9.5* 8.6* 10.5*  HCT 33.4* 29.5* 30.4* 28.3* 33.9*  MCV 89.1 89.1 88.6 89.8 88.1  PLT 296 306 294 271 301      Microbiology: Results for orders placed or performed during the hospital encounter of 10/14/14  Culture, blood (routine x 2)     Status: None   Collection Time: 10/16/14  9:16 AM  Result Value Ref Range Status   Specimen Description BLOOD PICC LINE  Final   Special Requests BOTTLES DRAWN AEROBIC AND ANAEROBIC  Final   Culture   Final    NO GROWTH 5 DAYS Performed at [facility]    Report Status 10/22/2014 FINAL   Final  Culture, blood (routine x 2)     Status: None   Collection Time: 10/16/14 10:25 AM  Result Value Ref Range Status   Specimen Description BLOOD RIGHT HAND  Final   Special Requests BOTTLES DRAWN AEROBIC ONLY 10CC  Final   Culture   Final    NO GROWTH 5 DAYS Performed at [facility]    Report Status 10/22/2014 FINAL  Final  Culture, blood (routine x 2)     Status: None   Collection Time: 10/16/14 10:53 PM  Result Value Ref Range Status   Specimen Description BLOOD RIGHT ANTECUBITAL  Final   Special Requests BOTTLES DRAWN AEROBIC AND ANAEROBIC 10CC  Final   Culture   Final    NO GROWTH 5 DAYS Performed at [facility]    Report Status 10/23/2014 FINAL  Final  Culture, blood (routine x 2)     Status: None   Collection Time: 10/16/14 11:00 PM  Result Value Ref Range Status   Specimen Description BLOOD PICC LINE  Final   Special  Requests BOTTLES DRAWN AEROBIC AND ANAEROBIC 10 CC  Final   Culture   Final    NO GROWTH 5 DAYS Performed at [facility]    Report Status 10/23/2014 FINAL  Final  Culture, expectorated sputum-assessment     Status: None   Collection Time: 10/17/14  6:15 AM  Result Value Ref Range Status   Specimen Description SPUTUM  Final   Special Requests NONE  Final   Sputum evaluation   Final    THIS SPECIMEN IS ACCEPTABLE. RESPIRATORY CULTURE REPORT TO FOLLOW.   Report Status 10/17/2014 FINAL  Final  Culture, respiratory (NON-Expectorated)     Status: None   Collection Time: 10/17/14  8:00 AM  Result Value Ref Range Status   Specimen Description SPUTUM  Final   Special Requests NONE  Final   Gram Stain   Final    NO WBC SEEN NO SQUAMOUS EPITHELIAL CELLS SEEN RARE GRAM POSITIVE COCCI IN PAIRS Performed at [facility]    Culture   Final    NORMAL OROPHARYNGEAL FLORA Performed at [facility]    Report Status 10/19/2014 FINAL  Final  Body fluid culture     Status: None   Collection Time: 10/18/14 10:07 AM   Result Value Ref Range Status   Specimen Description PLEURAL LEFT FLUID  Final   Special Requests NONE  Final   Gram Stain   Final    NO WBC SEEN NO ORGANISMS SEEN Performed at [facility]    Culture   Final    NO GROWTH 3 DAYS Performed at [facility]    Report Status 10/22/2014 FINAL  Final  Culture, body fluid-bottle     Status: None (Preliminary result)   Collection Time: 10/23/14  9:39 AM  Result Value Ref Range Status   Specimen Description FLUID BILE  Final   Special Requests NONE  Final   Culture NO GROWTH 2 DAYS  Final   Report Status PENDING  Incomplete  Gram stain     Status: None   Collection Time: 10/23/14  9:39 AM  Result Value Ref Range Status   Specimen Description FLUID BILE  Final   Special Requests NONE  Final   Gram Stain NO WBC SEEN NO ORGANISMS SEEN   Final   Report Status 10/23/2014 FINAL  Final    Coagulation Studies: No results for input(s): LABPROT, INR in the last 72 hours.  Urinalysis: No results for input(s): COLORURINE, LABSPEC, PHURINE, GLUCOSEU, HGBUR, BILIRUBINUR, KETONESUR, PROTEINUR, UROBILINOGEN, NITRITE, LEUKOCYTESUR in the last 72 hours.  Invalid input(s): APPERANCEUR    Imaging: Dg Abd Portable 1v  10/24/2014   CLINICAL DATA:  54 year old male he NG tube placement. Initial encounter.  EXAM: PORTABLE ABDOMEN - 1 VIEW  COMPARISON:  10/23/2014 and earlier.  FINDINGS: 2 supine portable views of the abdomen. NG tube placed, side hole at the level of the gastric body. Gastroptosis suspected. Right upper quadrant pigtail catheter in place. Visible bowel gas pattern is stable. Dense retrocardiac opacity with air bronchograms. No acute osseous abnormality identified.  IMPRESSION: 1. Enteric tube placed, side hole at the level of the gastric body. 2. Left lower lobe consolidation. 3. Stable bowel gas pattern.   Electronically Signed   By: Odessa Fleming M.D.   On: 10/24/2014 22:41     Medications:       Assessment/ Plan:   54 y.o. male  with  ESRD, HTN, CHF, Previous alcohol abuse, tobacco abse, , was admitted to  [facility] from [facility] on 10/14/2014 with resp failure. He is s/p Ex Lap, sigmoid colectomy and colostomy placement. He has a wound vac. Regular dialysis at [facility] center in [place], [place]  1. 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  3. SHPTH - phos 5.6 avoinding binders for now given abdominal issues  4. Abdominal distention  - s/p paracentesis.  5.  Acute respiratory failure:  Trach trial at present  [facility] 6/20/20162:41 PM

## 2014-10-27 DIAGNOSIS — E46 Unspecified protein-calorie malnutrition: Secondary | ICD-10-CM

## 2014-10-27 NOTE — Progress Notes (Signed)
Name: Harold Mayo MRN: [id number] DOB: 12-08-1960    ADMISSION DATE:  10/14/2014 CONSULTATION DATE: 10/15/2014  REFERRING MD :  [facility]  CHIEF COMPLAINT:  Resp failure  BRIEF PATIENT DESCRIPTION:  54 yo male admitted to [facility] 09/10/14 with abdominal pain.  Had laparotomy, colon resection and abscess drainage.  Developed respiratory failure with failure to wean from vent and required tracheostomy 6/01.  Transferred to [facility] 6/08 for wound care, rehab, and vent weaning.  SUBJECTIVE:  Weaning on ATC,  VITAL SIGNS: Afebrile RR 24-28/m satn 95% on ATC  PHYSICAL EXAMINATION: General: Comfortable on t collar Neuro:   Rass 0 follow simple commands HEENT: Tracheostomy 8 XLT Cardiovascular: HSR RRR Lungs: redcued Abdomen:  Open wound with mid line vac, colostomy Musculoskeletal:  intact Skin: warm and dry    CMP Latest Ref Rng 10/26/2014 10/25/2014 10/23/2014  Glucose 65 - 99 mg/dL 308(M) 578(I) 696(E)  BUN 6 - 20 mg/dL 95(M) 84(X) 16  Creatinine 0.61 - 1.24 mg/dL 3.24(M) 0.10(U) 7.25(D)  Sodium 135 - 145 mmol/L 131(L) 136 137  Potassium 3.5 - 5.1 mmol/L 4.7 3.9 3.4(L)  Chloride 101 - 111 mmol/L 95(L) 99(L) 96(L)  CO2 22 - 32 mmol/L 21(L) 27 27  Calcium 8.9 - 10.3 mg/dL 9.8 9.9 9.8  Total Protein 6.5 - 8.1 g/dL 8.9(H) 7.8 -  Total Bilirubin 0.3 - 1.2 mg/dL 3.0(H) 2.9(H) -  Alkaline Phos 38 - 126 U/L 107 94 -  AST 15 - 41 U/L 27 28 -  ALT 17 - 63 U/L 11(L) 12(L) -    CBC Latest Ref Rng 10/26/2014 10/23/2014 10/22/2014  WBC 4.0 - 10.5 K/uL 7.0 8.1 8.7  Hemoglobin 13.0 - 17.0 g/dL 10.5(L) 8.6(L) 9.5(L)  Hematocrit 39.0 - 52.0 % 33.9(L) 28.3(L) 30.4(L)  Platelets 150 - 400 K/uL 301 271 294    ABG    Component Value Date/Time   PHART 7.345* 10/16/2014 0500   PCO2ART 45.7* 10/16/2014 0500   PO2ART 84.1 10/16/2014 0500   HCO3 24.3* 10/16/2014 0500   TCO2 25.7 10/16/2014 0500   ACIDBASEDEF 0.6 10/16/2014 0500   O2SAT 95.1 10/16/2014 0500    No results  found. pcxr 6/10 left effusion  ASSESSMENT   Acute respiratory failure after laparotomy. Failure to wean from ventilatory s/p tracheostomy. Hx of tobacco abuse. bilat effusions - lt thora 6/12 - lymphocytic exudate Plan: -advance ATC as tolerated, on t collar 24/7 currently    Sigmoid colectomy, colostomy, Hartmann pouch with open wound and wound vac. Plan: - wound care, nutrition per primary team -vigilance for new infections , while onTNA  Hx of ESRD, DM type II, HTN, GERD, A flutter. S/p partial thyroidectomy, parathyroidectomy 10/07/14. Plan: - per primary team, cards following a flutter - for maximizing neg balance, see pulm  Delerium -  IV haldol prn  and taper klonopin to off  -ct seroquel for now  [facility] Minor ACNP [name] PCCM Pager [phone] till 3 pm If no answer page [phone] 10/27/2014, 65:37 AM  54 year old male with respiratory failure following abdominal surgery and prolonged hospitalization.  I reviewed the CXR myself, evidence of pleural effusion but improving.  Discussed with SSH-MD, PCCM-MD and RT bedside.  Acute respiratory failure due to deconditioning and prolonged hospitalization.  - Continue weaning as able.  - Pulmonary hygienes.  - Titrate O2 down as able for target sat of 88-92%.  Tracheostomy status  - No decannulation while delirious and deconditioned.  Sigmoid colectomy, colostomy, Hartmann pouch with  open wound and wound vac.  - Wound care, nutrition per primary team  - Vigilance for new infections , while onTNA  Delirium:   - IV haldol as ordered.  - Taper Klonopin to off as able.  - Continue seroquel for now.  Deconditioning:  - Continue PT efforts but patient is not cooperative and that is creating a great deal of difficulty.  Protein calorie malnutrition  - Continue TPN  - Will need to discuss when able to use GI tract.  Patient seen and examined, agree with above note.  I dictated the care and orders written for this  patient under my direction.  Alyson Reedy, MD [phone]

## 2014-10-28 LAB — CULTURE, BODY FLUID-BOTTLE: CULTURE: NO GROWTH

## 2014-10-28 LAB — RENAL FUNCTION PANEL
ANION GAP: 15 (ref 5–15)
Albumin: 3.2 g/dL — ABNORMAL LOW (ref 3.5–5.0)
BUN: 61 mg/dL — ABNORMAL HIGH (ref 6–20)
CALCIUM: 9.7 mg/dL (ref 8.9–10.3)
CO2: 25 mmol/L (ref 22–32)
Chloride: 90 mmol/L — ABNORMAL LOW (ref 101–111)
Creatinine, Ser: 5.35 mg/dL — ABNORMAL HIGH (ref 0.61–1.24)
GFR, EST AFRICAN AMERICAN: 13 mL/min — AB (ref >60)
GFR, EST NON AFRICAN AMERICAN: 11 mL/min — AB (ref >60)
Glucose, Bld: 135 mg/dL — ABNORMAL HIGH (ref 65–99)
POTASSIUM: 4.9 mmol/L (ref 3.5–5.1)
Phosphorus: 5.6 mg/dL — ABNORMAL HIGH (ref 2.5–4.6)
SODIUM: 130 mmol/L — AB (ref 135–145)

## 2014-10-28 LAB — BLOOD GAS, ARTERIAL
ACID-BASE EXCESS: 2.4 mmol/L — AB (ref 0.0–2.0)
Bicarbonate: 26.9 mEq/L — ABNORMAL HIGH (ref 20.0–24.0)
FIO2: 0.5 %
MECHVT: 500 mL
O2 Saturation: 99.6 %
PCO2 ART: 45.5 mmHg — AB (ref 35.0–45.0)
PEEP/CPAP: 5 cmH2O
PO2 ART: 191 mmHg — AB (ref 80.0–100.0)
Patient temperature: 98.6
RATE: 18 resp/min
TCO2: 28.3 mmol/L (ref 0–100)
pH, Arterial: 7.39 (ref 7.350–7.450)

## 2014-10-28 LAB — CBC
HCT: 29.2 % — ABNORMAL LOW (ref 39.0–52.0)
HEMOGLOBIN: 9 g/dL — AB (ref 13.0–17.0)
MCH: 26.8 pg (ref 26.0–34.0)
MCHC: 30.8 g/dL (ref 30.0–36.0)
MCV: 86.9 fL (ref 78.0–100.0)
PLATELETS: 225 10*3/uL (ref 150–400)
RBC: 3.36 MIL/uL — AB (ref 4.22–5.81)
RDW: 19 % — ABNORMAL HIGH (ref 11.5–15.5)
WBC: 5 10*3/uL (ref 4.0–10.5)

## 2014-10-28 LAB — CULTURE, BODY FLUID W GRAM STAIN -BOTTLE

## 2014-10-28 NOTE — Progress Notes (Signed)
Subjective:  Dialyzed earlier today 2000 cc removed   Objective:  Vital signs in last 24 hours:    129/78  87  33  98  Weight change:  There were no vitals filed for this visit.   Physical Exam: General: On the vent  HEENT [place]/AT OM moist, NG tube in place  Neck Trach in place  Pulm/lungs Bilateral rhonchi, trach trial at present  CVS/Heart S1S2 irregular  Abdomen:  Distended, decreased BS, wound vac in place, colosotomy in place, biliary drain  Extremities: 1+ pitting edema  Neurologic: Lethargic but arousable, follows simple commands  Skin: No acute rashes  Access: Left radiocephalic AVF       Basic Metabolic Panel:  Recent Labs Lab 10/21/14 1720 10/22/14 0710 10/23/14 0517 10/25/14 0630 10/26/14 0644 10/28/14 0635  NA 139 137  137 137 136 131* 130*  K 4.1 4.0  4.1 3.4* 3.9 4.7 4.9  CL 97* 95*  95* 96* 99* 95* 90*  CO2 24 23  22 27 27  21* 25  GLUCOSE 127* 125*  122* 124* 113* 163* 135*  BUN 8 13  13 16  33* 54* 61*  CREATININE 2.87* 3.60*  3.56* 4.00* 5.54* 6.19* 5.35*  CALCIUM 9.7 9.7  9.6 9.8 9.9 9.8 9.7  MG 2.2  --   --  2.0 2.0  --   PHOS 2.8 3.0 2.7  --  5.6* 5.6*     CBC:  Recent Labs Lab 10/22/14 0710 10/23/14 0517 10/26/14 0644 10/28/14 0635  WBC 8.7 8.1 7.0 5.0  HGB 9.5* 8.6* 10.5* 9.0*  HCT 30.4* 28.3* 33.9* 29.2*  MCV 88.6 89.8 88.1 86.9  PLT 294 271 301 225      Microbiology: Results for orders placed or performed during the hospital encounter of 10/14/14  Culture, blood (routine x 2)     Status: None   Collection Time: 10/16/14  9:16 AM  Result Value Ref Range Status   Specimen Description BLOOD PICC LINE  Final   Special Requests BOTTLES DRAWN AEROBIC AND ANAEROBIC  Final   Culture   Final    NO GROWTH 5 DAYS Performed at [facility]    Report Status 10/22/2014 FINAL  Final  Culture, blood (routine x 2)     Status: None   Collection Time: 10/16/14 10:25 AM  Result Value Ref Range Status   Specimen  Description BLOOD RIGHT HAND  Final   Special Requests BOTTLES DRAWN AEROBIC ONLY 10CC  Final   Culture   Final    NO GROWTH 5 DAYS Performed at [facility]    Report Status 10/22/2014 FINAL  Final  Culture, blood (routine x 2)     Status: None   Collection Time: 10/16/14 10:53 PM  Result Value Ref Range Status   Specimen Description BLOOD RIGHT ANTECUBITAL  Final   Special Requests BOTTLES DRAWN AEROBIC AND ANAEROBIC 10CC  Final   Culture   Final    NO GROWTH 5 DAYS Performed at [facility]    Report Status 10/23/2014 FINAL  Final  Culture, blood (routine x 2)     Status: None   Collection Time: 10/16/14 11:00 PM  Result Value Ref Range Status   Specimen Description BLOOD PICC LINE  Final   Special Requests BOTTLES DRAWN AEROBIC AND ANAEROBIC 10 CC  Final   Culture   Final    NO GROWTH 5 DAYS Performed at [facility]    Report Status 10/23/2014 FINAL  Final  Culture, expectorated sputum-assessment     Status: None   Collection Time: 10/17/14  6:15 AM  Result Value Ref Range Status   Specimen Description SPUTUM  Final   Special Requests NONE  Final   Sputum evaluation   Final    THIS SPECIMEN IS ACCEPTABLE. RESPIRATORY CULTURE REPORT TO FOLLOW.   Report Status 10/17/2014 FINAL  Final  Culture, respiratory (NON-Expectorated)     Status: None   Collection Time: 10/17/14  8:00 AM  Result Value Ref Range Status   Specimen Description SPUTUM  Final   Special Requests NONE  Final   Gram Stain   Final    NO WBC SEEN NO SQUAMOUS EPITHELIAL CELLS SEEN RARE GRAM POSITIVE COCCI IN PAIRS Performed at [facility]    Culture   Final    NORMAL OROPHARYNGEAL FLORA Performed at [facility]    Report Status 10/19/2014 FINAL  Final  Body fluid culture     Status: None   Collection Time: 10/18/14 10:07 AM  Result Value Ref Range Status   Specimen Description PLEURAL LEFT FLUID  Final   Special Requests NONE  Final   Gram Stain   Final     NO WBC SEEN NO ORGANISMS SEEN Performed at [facility]    Culture   Final    NO GROWTH 3 DAYS Performed at [facility]    Report Status 10/22/2014 FINAL  Final  Culture, body fluid-bottle     Status: None   Collection Time: 10/23/14  9:39 AM  Result Value Ref Range Status   Specimen Description FLUID BILE  Final   Special Requests NONE  Final   Culture NO GROWTH 5 DAYS  Final   Report Status 10/28/2014 FINAL  Final  Gram stain     Status: None   Collection Time: 10/23/14  9:39 AM  Result Value Ref Range Status   Specimen Description FLUID BILE  Final   Special Requests NONE  Final   Gram Stain NO WBC SEEN NO ORGANISMS SEEN   Final   Report Status 10/23/2014 FINAL  Final    Coagulation Studies: No results for input(s): LABPROT, INR in the last 72 hours.  Urinalysis: No results for input(s): COLORURINE, LABSPEC, PHURINE, GLUCOSEU, HGBUR, BILIRUBINUR, KETONESUR, PROTEINUR, UROBILINOGEN, NITRITE, LEUKOCYTESUR in the last 72 hours.  Invalid input(s): APPERANCEUR    Imaging: No results found.   Medications:       Assessment/ Plan:  54 y.o. male  with  ESRD, HTN, CHF, Previous alcohol abuse, tobacco abse, , was admitted to [facility] from [facility] on 10/14/2014 with resp failure. He is s/p Ex Lap, sigmoid colectomy and colostomy placement. He has a wound vac. Regular dialysis at [facility] center in [place], [place]  1. ESRD - doing fair overall - 2000 cc removed today - Next HD on Friday  2. AOCKD - hgb 9, continue aranesp, follow CBC.  3. SHPTH - phos 5.6 avoinding binders for now given abdominal issues  4. Abdominal distention  - s/p paracentesis.  5.  Acute respiratory failure:  Trach trial at present    [facility] 6/22/20164:09 PM

## 2014-10-29 DIAGNOSIS — F05 Delirium due to known physiological condition: Secondary | ICD-10-CM

## 2014-10-29 LAB — CLOSTRIDIUM DIFFICILE BY PCR: Toxigenic C. Difficile by PCR: NEGATIVE

## 2014-10-29 NOTE — Progress Notes (Signed)
Name: Harold Mayo MRN: [id number] DOB: 1961/02/12    ADMISSION DATE:  10/14/2014 CONSULTATION DATE: 10/15/2014  REFERRING MD :  [facility]  CHIEF COMPLAINT:  Resp failure  BRIEF PATIENT DESCRIPTION:  54 yo male admitted to [facility] 09/10/14 with abdominal pain.  Had laparotomy, colon resection and abscess drainage.  Developed respiratory failure with failure to wean from vent and required tracheostomy 6/01.  Transferred to [facility] 6/08 for wound care, rehab, and vent weaning.  SUBJECTIVE:  Weaning on ATC,  VITAL SIGNS: Afebrile RR 24-28/m satn 95% on ATC  PHYSICAL EXAMINATION: General: Comfortable on vent Neuro:   Rass 0 follow simple commands HEENT: Tracheostomy 8 XLT Cardiovascular: HSR RRR Lungs: redcued Abdomen:  Open wound with mid line vac, colostomy Musculoskeletal:  intact Skin: warm and dry    CMP Latest Ref Rng 10/28/2014 10/26/2014 10/25/2014  Glucose 65 - 99 mg/dL 768(T) 157(W) 620(B)  BUN 6 - 20 mg/dL 55(H) 74(B) 63(A)  Creatinine 0.61 - 1.24 mg/dL 4.53(M) 4.68(E) 3.21(Y)  Sodium 135 - 145 mmol/L 130(L) 131(L) 136  Potassium 3.5 - 5.1 mmol/L 4.9 4.7 3.9  Chloride 101 - 111 mmol/L 90(L) 95(L) 99(L)  CO2 22 - 32 mmol/L 25 21(L) 27  Calcium 8.9 - 10.3 mg/dL 9.7 9.8 9.9  Total Protein 6.5 - 8.1 g/dL - 8.9(H) 7.8  Total Bilirubin 0.3 - 1.2 mg/dL - 3.0(H) 2.9(H)  Alkaline Phos 38 - 126 U/L - 107 94  AST 15 - 41 U/L - 27 28  ALT 17 - 63 U/L - 11(L) 12(L)    CBC Latest Ref Rng 10/28/2014 10/26/2014 10/23/2014  WBC 4.0 - 10.5 K/uL 5.0 7.0 8.1  Hemoglobin 13.0 - 17.0 g/dL 9.0(L) 10.5(L) 8.6(L)  Hematocrit 39.0 - 52.0 % 29.2(L) 33.9(L) 28.3(L)  Platelets 150 - 400 K/uL 225 301 271    ABG    Component Value Date/Time   PHART 7.390 10/28/2014 2339   PCO2ART 45.5* 10/28/2014 2339   PO2ART 191* 10/28/2014 2339   HCO3 26.9* 10/28/2014 2339   TCO2 28.3 10/28/2014 2339   ACIDBASEDEF 0.6 10/16/2014 0500   O2SAT 99.6 10/28/2014 2339    No results  found. pcxr 6/10 left effusion  ASSESSMENT   Acute respiratory failure after laparotomy. Failure to wean from ventilatory s/p tracheostomy. Hx of tobacco abuse. bilat effusions - lt thora 6/12 - lymphocytic exudate Plan: -advance ATC as tolerated, on vent currently   Sigmoid colectomy, colostomy, Hartmann pouch with open wound and wound vac. Plan: - wound care, nutrition per primary team -vigilance for new infections , while onTNA  Hx of ESRD, DM type II, HTN, GERD, A flutter. S/p partial thyroidectomy, parathyroidectomy 10/07/14. Plan: - per primary team, cards following a flutter - for maximizing neg balance, see pulm  Delerium -  IV haldol prn  and taper klonopin to off  -ct seroquel for now  [facility] Minor ACNP [name] PCCM Pager [phone] till 3 pm If no answer page [phone] 10/29/2014, 58:35 AM  54 year old male with respiratory failure following abdominal surgery and prolonged hospitalization. I reviewed the CXR myself, evidence of pleural effusion but improving. Discussed with SSH-MD, PCCM-MD and RT bedside.  Continue weaning efforts.  Acute respiratory failure due to deconditioning and prolonged hospitalization. - Continue weaning as able. - Pulmonary hygienes. - Titrate O2 down as able for target sat of 88-92%.  - Anticipate will need a vent SNF if not weaning well in the next 2 wks or so.  Tracheostomy status -  No decannulation while delirious and deconditioned.  Sigmoid colectomy, colostomy, Hartmann pouch with open wound and wound vac. - Wound care, nutrition per primary team - Vigilance for new infections , while onTNA  Delirium:  - IV haldol as ordered. - Taper Klonopin to off as able. - Continue seroquel for now.  Deconditioning: - Continue PT efforts but patient is not cooperative and that is creating a great deal of  difficulty.  Protein calorie malnutrition - Continue TPN - Will need to discuss when able to use GI tract.  Patient seen and examined, agree with above note. I dictated the care and orders written for this patient under my direction.  Alyson Reedy, MD [phone]

## 2014-10-30 ENCOUNTER — Encounter: Payer: Self-pay | Admitting: Radiology

## 2014-10-30 ENCOUNTER — Other Ambulatory Visit (HOSPITAL_COMMUNITY): Payer: Self-pay

## 2014-10-30 LAB — RENAL FUNCTION PANEL
Albumin: 2.7 g/dL — ABNORMAL LOW (ref 3.5–5.0)
Anion gap: 11 (ref 5–15)
BUN: 57 mg/dL — ABNORMAL HIGH (ref 6–20)
CO2: 25 mmol/L (ref 22–32)
Calcium: 9.3 mg/dL (ref 8.9–10.3)
Chloride: 96 mmol/L — ABNORMAL LOW (ref 101–111)
Creatinine, Ser: 5.31 mg/dL — ABNORMAL HIGH (ref 0.61–1.24)
GFR calc non Af Amer: 11 mL/min — ABNORMAL LOW (ref >60)
GFR, EST AFRICAN AMERICAN: 13 mL/min — AB (ref >60)
Glucose, Bld: 92 mg/dL (ref 65–99)
POTASSIUM: 4.7 mmol/L (ref 3.5–5.1)
Phosphorus: 3.6 mg/dL (ref 2.5–4.6)
SODIUM: 132 mmol/L — AB (ref 135–145)

## 2014-10-30 LAB — CBC
HEMATOCRIT: 25.2 % — AB (ref 39.0–52.0)
HEMOGLOBIN: 7.9 g/dL — AB (ref 13.0–17.0)
MCH: 26.8 pg (ref 26.0–34.0)
MCHC: 31.3 g/dL (ref 30.0–36.0)
MCV: 85.4 fL (ref 78.0–100.0)
Platelets: 173 10*3/uL (ref 150–400)
RBC: 2.95 MIL/uL — AB (ref 4.22–5.81)
RDW: 19.2 % — ABNORMAL HIGH (ref 11.5–15.5)
WBC: 7.2 10*3/uL (ref 4.0–10.5)

## 2014-10-30 LAB — COMPREHENSIVE METABOLIC PANEL
ALBUMIN: 2.7 g/dL — AB (ref 3.5–5.0)
ALT: 8 U/L — ABNORMAL LOW (ref 17–63)
ANION GAP: 11 (ref 5–15)
AST: 21 U/L (ref 15–41)
Alkaline Phosphatase: 122 U/L (ref 38–126)
BUN: 57 mg/dL — AB (ref 6–20)
CO2: 25 mmol/L (ref 22–32)
CREATININE: 5.33 mg/dL — AB (ref 0.61–1.24)
Calcium: 9.3 mg/dL (ref 8.9–10.3)
Chloride: 96 mmol/L — ABNORMAL LOW (ref 101–111)
GFR calc non Af Amer: 11 mL/min — ABNORMAL LOW (ref >60)
GFR, EST AFRICAN AMERICAN: 13 mL/min — AB (ref >60)
GLUCOSE: 93 mg/dL (ref 65–99)
Potassium: 4.7 mmol/L (ref 3.5–5.1)
Sodium: 132 mmol/L — ABNORMAL LOW (ref 135–145)
TOTAL PROTEIN: 7.2 g/dL (ref 6.5–8.1)
Total Bilirubin: 2.7 mg/dL — ABNORMAL HIGH (ref 0.3–1.2)

## 2014-10-30 LAB — PROTIME-INR
INR: 1.45 (ref 0.00–1.49)
Prothrombin Time: 17.7 seconds — ABNORMAL HIGH (ref 11.6–15.2)

## 2014-10-30 LAB — VANCOMYCIN, TROUGH: Vancomycin Tr: 55 ug/mL (ref 10.0–20.0)

## 2014-10-30 MED ORDER — IOHEXOL 350 MG/ML SOLN
100.0000 mL | Freq: Once | INTRAVENOUS | Status: AC | PRN
  Administered 2014-10-30: 100 mL via INTRAVENOUS

## 2014-10-30 NOTE — Progress Notes (Addendum)
Subjective:  Pt underwent dialysis today. Resting comfortably in bed at the moment.  Remains on the vent.  Objective:  Vital signs in last 24 hours:  98 100 21 12147  Weight change:  There were no vitals filed for this visit.   Physical Exam: General: On the vent, NAD  HEENT [place]/AT OM moist, NG tube in place  Neck Trach in place  Pulm/lungs Bilateral rhonchi, vent assisted  CVS/Heart S1S2 irregular  Abdomen:  Distended, BS present, wound vac in place, colosotomy in place, biliary drain  Extremities: 1+ pitting edema  Neurologic: Awake, alert, follows commands  Skin: No acute rashes  Access: Left radiocephalic AVF       Basic Metabolic Panel:  Recent Labs Lab 10/25/14 0630 10/26/14 0644 10/28/14 0635 10/30/14 0602  NA 136 131* 130* 132*  132*  K 3.9 4.7 4.9 4.7  4.7  CL 99* 95* 90* 96*  96*  CO2 27 21* 25 25  25   GLUCOSE 113* 163* 135* 93  92  BUN 33* 54* 61* 57*  57*  CREATININE 5.54* 6.19* 5.35* 5.33*  5.31*  CALCIUM 9.9 9.8 9.7 9.3  9.3  MG 2.0 2.0  --   --   PHOS  --  5.6* 5.6* 3.6     CBC:  Recent Labs Lab 10/26/14 0644 10/28/14 0635 10/30/14 0602  WBC 7.0 5.0 7.2  HGB 10.5* 9.0* 7.9*  HCT 33.9* 29.2* 25.2*  MCV 88.1 86.9 85.4  PLT 301 225 173      Microbiology: Results for orders placed or performed during the hospital encounter of 10/14/14  Culture, blood (routine x 2)     Status: None   Collection Time: 10/16/14  9:16 AM  Result Value Ref Range Status   Specimen Description BLOOD PICC LINE  Final   Special Requests BOTTLES DRAWN AEROBIC AND ANAEROBIC  Final   Culture   Final    NO GROWTH 5 DAYS Performed at [facility]    Report Status 10/22/2014 FINAL  Final  Culture, blood (routine x 2)     Status: None   Collection Time: 10/16/14 10:25 AM  Result Value Ref Range Status   Specimen Description BLOOD RIGHT HAND  Final   Special Requests BOTTLES DRAWN AEROBIC ONLY 10CC  Final   Culture   Final    NO GROWTH  5 DAYS Performed at [facility]    Report Status 10/22/2014 FINAL  Final  Culture, blood (routine x 2)     Status: None   Collection Time: 10/16/14 10:53 PM  Result Value Ref Range Status   Specimen Description BLOOD RIGHT ANTECUBITAL  Final   Special Requests BOTTLES DRAWN AEROBIC AND ANAEROBIC 10CC  Final   Culture   Final    NO GROWTH 5 DAYS Performed at [facility]    Report Status 10/23/2014 FINAL  Final  Culture, blood (routine x 2)     Status: None   Collection Time: 10/16/14 11:00 PM  Result Value Ref Range Status   Specimen Description BLOOD PICC LINE  Final   Special Requests BOTTLES DRAWN AEROBIC AND ANAEROBIC 10 CC  Final   Culture   Final    NO GROWTH 5 DAYS Performed at [facility]    Report Status 10/23/2014 FINAL  Final  Culture, expectorated sputum-assessment     Status: None   Collection Time: 10/17/14  6:15 AM  Result Value Ref Range Status   Specimen Description SPUTUM  Final  Special Requests NONE  Final   Sputum evaluation   Final    THIS SPECIMEN IS ACCEPTABLE. RESPIRATORY CULTURE REPORT TO FOLLOW.   Report Status 10/17/2014 FINAL  Final  Culture, respiratory (NON-Expectorated)     Status: None   Collection Time: 10/17/14  8:00 AM  Result Value Ref Range Status   Specimen Description SPUTUM  Final   Special Requests NONE  Final   Gram Stain   Final    NO WBC SEEN NO SQUAMOUS EPITHELIAL CELLS SEEN RARE GRAM POSITIVE COCCI IN PAIRS Performed at [facility]    Culture   Final    NORMAL OROPHARYNGEAL FLORA Performed at [facility]    Report Status 10/19/2014 FINAL  Final  Body fluid culture     Status: None   Collection Time: 10/18/14 10:07 AM  Result Value Ref Range Status   Specimen Description PLEURAL LEFT FLUID  Final   Special Requests NONE  Final   Gram Stain   Final    NO WBC SEEN NO ORGANISMS SEEN Performed at [facility]    Culture   Final    NO GROWTH 3 DAYS Performed at  [facility]    Report Status 10/22/2014 FINAL  Final  Culture, body fluid-bottle     Status: None   Collection Time: 10/23/14  9:39 AM  Result Value Ref Range Status   Specimen Description FLUID BILE  Final   Special Requests NONE  Final   Culture NO GROWTH 5 DAYS  Final   Report Status 10/28/2014 FINAL  Final  Gram stain     Status: None   Collection Time: 10/23/14  9:39 AM  Result Value Ref Range Status   Specimen Description FLUID BILE  Final   Special Requests NONE  Final   Gram Stain NO WBC SEEN NO ORGANISMS SEEN   Final   Report Status 10/23/2014 FINAL  Final  Clostridium Difficile by PCR (not at [facility])     Status: None   Collection Time: 10/29/14  4:59 AM  Result Value Ref Range Status   C difficile by pcr NEGATIVE NEGATIVE Final    Coagulation Studies:  Recent Labs  10/30/14 1022  LABPROT 17.7*  INR 1.45    Urinalysis: No results for input(s): COLORURINE, LABSPEC, PHURINE, GLUCOSEU, HGBUR, BILIRUBINUR, KETONESUR, PROTEINUR, UROBILINOGEN, NITRITE, LEUKOCYTESUR in the last 72 hours.  Invalid input(s): APPERANCEUR    Imaging: Dg Chest Port 1 View  10/30/2014   CLINICAL DATA:  Respiratory failure  EXAM: PORTABLE CHEST - 1 VIEW  COMPARISON:  October 18, 2014  FINDINGS: Tracheostomy catheter tip is 3.5 cm above the carina. Nasogastric tube tip and side port are below the diaphragm. Central catheter tip is in the superior vena cava. No pneumothorax. There is increased left lower lobe consolidation with small left effusion. There is hazy opacity in the right base, felt to represent a combination of small effusion and infiltrate. Heart is enlarged with pulmonary vascularity within normal limits. No demonstrable adenopathy.  IMPRESSION: Increase in left lower lobe consolidation. Small bilateral effusions. Hazy opacity in the right base persists without change. Tube and catheter positions as described without pneumothorax.   Electronically Signed   By: Bretta Bang  III M.D.   On: 10/30/2014 08:13     Medications:       Assessment/ Plan:  54 y.o. male  with  ESRD, HTN, CHF, Previous alcohol abuse, tobacco abse, , was admitted to [facility] from [facility] on  10/14/2014 with resp failure. He is s/p Ex Lap, sigmoid colectomy and colostomy placement. He has a wound vac. Regular dialysis at [facility] center in [place], [place]  1. ESRD - Pt had HD today. Tolerated well.  Next HD on Monday.  2. AOCKD - hgb down a bit to 7.9, will continue aranesp and monitor hgb.  3. SHPTH - phos down to 3.6 and acceptable, will continue to monitor.  4. Abdominal distention  - s/p paracentesis.  5.  Acute respiratory failure:  Remains on the vent at present, pulm cc following, pt had aggressive UF previously to aid in getting off the vent.   Jaeley Wiker 6/24/20165:13 PM

## 2014-10-31 LAB — CBC
HEMATOCRIT: 23 % — AB (ref 39.0–52.0)
Hemoglobin: 7.3 g/dL — ABNORMAL LOW (ref 13.0–17.0)
MCH: 27.2 pg (ref 26.0–34.0)
MCHC: 31.7 g/dL (ref 30.0–36.0)
MCV: 85.8 fL (ref 78.0–100.0)
Platelets: 172 10*3/uL (ref 150–400)
RBC: 2.68 MIL/uL — ABNORMAL LOW (ref 4.22–5.81)
RDW: 19.2 % — ABNORMAL HIGH (ref 11.5–15.5)
WBC: 4.3 10*3/uL (ref 4.0–10.5)

## 2014-11-02 LAB — CBC
HCT: 22.2 % — ABNORMAL LOW (ref 39.0–52.0)
HCT: 24.1 % — ABNORMAL LOW (ref 39.0–52.0)
Hemoglobin: 7.1 g/dL — ABNORMAL LOW (ref 13.0–17.0)
Hemoglobin: 7.5 g/dL — ABNORMAL LOW (ref 13.0–17.0)
MCH: 27.2 pg (ref 26.0–34.0)
MCH: 26.6 pg (ref 26.0–34.0)
MCHC: 32 g/dL (ref 30.0–36.0)
MCHC: 31.1 g/dL (ref 30.0–36.0)
MCV: 85.1 fL (ref 78.0–100.0)
MCV: 85.5 fL (ref 78.0–100.0)
PLATELETS: 163 10*3/uL (ref 150–400)
Platelets: 159 10*3/uL (ref 150–400)
RBC: 2.61 MIL/uL — AB (ref 4.22–5.81)
RBC: 2.82 MIL/uL — ABNORMAL LOW (ref 4.22–5.81)
RDW: 18.8 % — ABNORMAL HIGH (ref 11.5–15.5)
RDW: 18.6 % — AB (ref 11.5–15.5)
WBC: 4.4 10*3/uL (ref 4.0–10.5)
WBC: 4.5 10*3/uL (ref 4.0–10.5)

## 2014-11-02 LAB — COMPREHENSIVE METABOLIC PANEL
ALT: 10 U/L — ABNORMAL LOW (ref 17–63)
AST: 22 U/L (ref 15–41)
Albumin: 2.7 g/dL — ABNORMAL LOW (ref 3.5–5.0)
Alkaline Phosphatase: 196 U/L — ABNORMAL HIGH (ref 38–126)
Anion gap: 11 (ref 5–15)
BUN: 52 mg/dL — AB (ref 6–20)
CALCIUM: 9.3 mg/dL (ref 8.9–10.3)
CO2: 28 mmol/L (ref 22–32)
CREATININE: 6.21 mg/dL — AB (ref 0.61–1.24)
Chloride: 96 mmol/L — ABNORMAL LOW (ref 101–111)
GFR calc Af Amer: 11 mL/min — ABNORMAL LOW (ref >60)
GFR calc non Af Amer: 9 mL/min — ABNORMAL LOW (ref >60)
Glucose, Bld: 98 mg/dL (ref 65–99)
POTASSIUM: 4 mmol/L (ref 3.5–5.1)
Sodium: 135 mmol/L (ref 135–145)
TOTAL PROTEIN: 6.9 g/dL (ref 6.5–8.1)
Total Bilirubin: 1.8 mg/dL — ABNORMAL HIGH (ref 0.3–1.2)

## 2014-11-02 LAB — RENAL FUNCTION PANEL
ALBUMIN: 2.8 g/dL — AB (ref 3.5–5.0)
ANION GAP: 10 (ref 5–15)
BUN: 54 mg/dL — AB (ref 6–20)
CO2: 31 mmol/L (ref 22–32)
Calcium: 9.5 mg/dL (ref 8.9–10.3)
Chloride: 95 mmol/L — ABNORMAL LOW (ref 101–111)
Creatinine, Ser: 6.39 mg/dL — ABNORMAL HIGH (ref 0.61–1.24)
GFR calc Af Amer: 10 mL/min — ABNORMAL LOW (ref >60)
GFR calc non Af Amer: 9 mL/min — ABNORMAL LOW (ref >60)
GLUCOSE: 97 mg/dL (ref 65–99)
POTASSIUM: 4.3 mmol/L (ref 3.5–5.1)
Phosphorus: 4.6 mg/dL (ref 2.5–4.6)
Sodium: 136 mmol/L (ref 135–145)

## 2014-11-02 LAB — MAGNESIUM: Magnesium: 2.6 mg/dL — ABNORMAL HIGH (ref 1.7–2.4)

## 2014-11-02 LAB — VANCOMYCIN, TROUGH: Vancomycin Tr: 19 ug/mL (ref 10.0–20.0)

## 2014-11-02 LAB — TRIGLYCERIDES: TRIGLYCERIDES: 96 mg/dL (ref <150)

## 2014-11-02 LAB — PHOSPHORUS: Phosphorus: 4.3 mg/dL (ref 2.5–4.6)

## 2014-11-02 NOTE — Progress Notes (Signed)
Subjective:  Pt underwent dialysis today. Resting comfortably in bed at the moment.  Remains on the vent. bfr 400 dfr 800 1500 cc removed  Objective:  Vital signs in last 24 hours:  98.6  94  22  105/76   Weight change:  There were no vitals filed for this visit.   Physical Exam: General: On the vent, NAD  HEENT [place]/AT OM moist, NG tube in place  Neck Trach in place  Pulm/lungs Bilateral rhonchi,    CVS/Heart S1S2 irregular  Abdomen:  Distended, BS present, wound vac in place, colosotomy in place, biliary drain  Extremities: 1+ pitting edema  Neurologic: Awake, alert, follows commands  Skin: No acute rashes  Access: Left radiocephalic AVF       Basic Metabolic Panel:  Recent Labs Lab 10/28/14 0635 10/30/14 0602 11/02/14 0535 11/02/14 0830  NA 130* 132*  132* 135 136  K 4.9 4.7  4.7 4.0 4.3  CL 90* 96*  96* 96* 95*  CO2 25 25  25 28 31   GLUCOSE 135* 93  92 98 97  BUN 61* 57*  57* 52* 54*  CREATININE 5.35* 5.33*  5.31* 6.21* 6.39*  CALCIUM 9.7 9.3  9.3 9.3 9.5  MG  --   --  2.6*  --   PHOS 5.6* 3.6 4.3 4.6     CBC:  Recent Labs Lab 10/28/14 0635 10/30/14 0602 10/31/14 0450 11/02/14 0535 11/02/14 0830  WBC 5.0 7.2 4.3 4.4 4.5  HGB 9.0* 7.9* 7.3* 7.1* 7.5*  HCT 29.2* 25.2* 23.0* 22.2* 24.1*  MCV 86.9 85.4 85.8 85.1 85.5  PLT 225 173 172 163 159      Microbiology: Results for orders placed or performed during the hospital encounter of 10/14/14  Culture, blood (routine x 2)     Status: None   Collection Time: 10/16/14  9:16 AM  Result Value Ref Range Status   Specimen Description BLOOD PICC LINE  Final   Special Requests BOTTLES DRAWN AEROBIC AND ANAEROBIC 10ML  Final   Culture   Final    NO GROWTH 5 DAYS [facility]formed [facility]    Report Status 10/22/2014 FINAL  Final  Culture, blood (routine x 2)     Status: None   Collection Time: 10/16/14 10:25 AM  Result Value Ref Range Status   Specimen Description BLOOD RIGHT  HAND  Final   Special Requests BOTTLES DRAWN AEROBIC ONLY 10CC  Final   Culture   Final    NO GROWTH 5 DAYS Performed at [facility]    Report Status 10/22/2014 FINAL  Final  Culture, blood (routine x 2)     Status: None   Collection Time: 10/16/14 10:53 PM  Result Value Ref Range Status   Specimen Description BLOOD RIGHT ANTECUBITAL  Final   Special Requests BOTTLES DRAWN AEROBIC AND ANAEROBIC 10CC  Final   Culture   Final    NO GROWTH 5 DAYS Performed at [facility]    Report Status 10/23/2014 FINAL  Final  Culture, blood (routine x 2)     Status: None   Collection Time: 10/16/14 11:00 PM  Result Value Ref Range Status   Specimen Description BLOOD PICC LINE  Final   Special Requests BOTTLES DRAWN AEROBIC AND ANAEROBIC 10 CC  Final   Culture   Final    NO GROWTH 5 DAYS Performed at [facility]    Report Status 10/23/2014 FINAL  Final  Culture, expectorated sputum-assessment  Status: None   Collection Time: 10/17/14  6:15 AM  Result Value Ref Range Status   Specimen Description SPUTUM  Final   Special Requests NONE  Final   Sputum evaluation   Final    THIS SPECIMEN IS ACCEPTABLE. RESPIRATORY CULTURE REPORT TO FOLLOW.   Report Status 10/17/2014 FINAL  Final  Culture, respiratory (NON-Expectorated)     Status: None   Collection Time: 10/17/14  8:00 AM  Result Value Ref Range Status   Specimen Description SPUTUM  Final   Special Requests NONE  Final   Gram Stain   Final    NO WBC SEEN NO SQUAMOUS EPITHELIAL CELLS SEEN RARE GRAM POSITIVE COCCI IN PAIRS Performed at [facility]    Culture   Final    NORMAL OROPHARYNGEAL FLORA Performed at [facility]    Report Status 10/19/2014 FINAL  Final  Body fluid culture     Status: None   Collection Time: 10/18/14 10:07 AM  Result Value Ref Range Status   Specimen Description PLEURAL LEFT FLUID  Final   Special Requests NONE  Final   Gram Stain   Final    NO WBC SEEN NO  ORGANISMS SEEN Performed at [facility]    Culture   Final    NO GROWTH 3 DAYS Performed at [facility]    Report Status 10/22/2014 FINAL  Final  Culture, body fluid-bottle     Status: None   Collection Time: 10/23/14  9:39 AM  Result Value Ref Range Status   Specimen Description FLUID BILE  Final   Special Requests NONE  Final   Culture NO GROWTH 5 DAYS  Final   Report Status 10/28/2014 FINAL  Final  Gram stain     Status: None   Collection Time: 10/23/14  9:39 AM  Result Value Ref Range Status   Specimen Description FLUID BILE  Final   Special Requests NONE  Final   Gram Stain NO WBC SEEN NO ORGANISMS SEEN   Final   Report Status 10/23/2014 FINAL  Final  Clostridium Difficile by PCR (not at [facility])     Status: None   Collection Time: 10/29/14  4:59 AM  Result Value Ref Range Status   C difficile by pcr NEGATIVE NEGATIVE Final    Coagulation Studies: No results for input(s): LABPROT, INR in the last 72 hours.  Urinalysis: No results for input(s): COLORURINE, LABSPEC, PHURINE, GLUCOSEU, HGBUR, BILIRUBINUR, KETONESUR, PROTEINUR, UROBILINOGEN, NITRITE, LEUKOCYTESUR in the last 72 hours.  Invalid input(s): APPERANCEUR    Imaging: No results found.   Medications:       Assessment/ Plan:  54 y.o. male  with  ESRD, HTN, CHF, Previous alcohol abuse, tobacco abse, , was admitted to [facility] from [facility] on 10/14/2014 with resp failure. He is s/p Ex Lap, sigmoid colectomy and colostomy placement. He has a wound vac. Regular dialysis at [facility] center in [place], [place]  1. ESRD - Pt had HD today. Tolerated well.    2. AOCKD - hgb down a bit to 7.5, restart aranesp and monitor hgb.  3. SHPTH - phos 4.6 and acceptable, will continue to monitor.  4. Abdominal distention  - s/p paracentesis.  5.  Acute respiratory failure:  Trach trial at present  [facility] 6/27/20162:29 PM

## 2014-11-03 DIAGNOSIS — R5381 Other malaise: Secondary | ICD-10-CM

## 2014-11-03 NOTE — Progress Notes (Signed)
Name: Harold Mayo MRN: [id number] DOB: 31-Jan-1961    ADMISSION DATE:  10/14/2014 CONSULTATION DATE: 10/15/2014  REFERRING MD :  [facility]  CHIEF COMPLAINT:  Resp failure  BRIEF PATIENT DESCRIPTION:  54 yo male admitted to [facility] 09/10/14 with abdominal pain.  Had laparotomy, colon resection and abscess drainage.  Developed respiratory failure with failure to wean from vent and required tracheostomy 6/01.  Transferred to [facility] 6/08 for wound care, rehab, and vent weaning.  SUBJECTIVE:  Weaning on ATC,  VITAL SIGNS: 109/69 105 18 100% 99.3  PHYSICAL EXAMINATION: General: Comfortable on t collar Neuro:   Rass 1 follow simple commands, speaks HEENT: Tracheostomy 8 XLT Cardiovascular: HSR RRR Lungs: redcued Abdomen:  Open wound with mid line vac, colostomy Musculoskeletal:  intact Skin: warm and dry    CMP Latest Ref Rng 11/02/2014 11/02/2014 10/30/2014  Glucose 65 - 99 mg/dL 97 98 93  BUN 6 - 20 mg/dL 40(J) 81(X) 91(Y)  Creatinine 0.61 - 1.24 mg/dL 7.82(N) 5.62(Z) 3.08(M)  Sodium 135 - 145 mmol/L 136 135 132(L)  Potassium 3.5 - 5.1 mmol/L 4.3 4.0 4.7  Chloride 101 - 111 mmol/L 95(L) 96(L) 96(L)  CO2 22 - 32 mmol/L Calcium 8.9 - 10.3 mg/dL 9.5 9.3 9.3  Total Protein 6.5 - 8.1 g/dL - 6.9 7.2  Total Bilirubin 0.3 - 1.2 mg/dL - 1.8(H) 2.7(H)  Alkaline Phos 38 - 126 U/L - 196(H) 122  AST 15 - 41 U/L - 22 21  ALT 17 - 63 U/L - 10(L) 8(L)    CBC Latest Ref Rng 11/02/2014 11/02/2014 10/31/2014  WBC 4.0 - 10.5 K/uL 4.5 4.4 4.3  Hemoglobin 13.0 - 17.0 g/dL 7.5(L) 7.1(L) 7.3(L)  Hematocrit 39.0 - 52.0 % 24.1(L) 22.2(L) 23.0(L)  Platelets 150 - 400 K/uL 159 163 172    ABG    Component Value Date/Time   PHART 7.390 10/28/2014 2339   PCO2ART 45.5* 10/28/2014 2339   PO2ART 191* 10/28/2014 2339   HCO3 26.9* 10/28/2014 2339   TCO2 28.3 10/28/2014 2339   ACIDBASEDEF 0.6 10/16/2014 0500   O2SAT 99.6 10/28/2014 2339    No results found. pcxr 6/10 left  effusion  ASSESSMENT   Currently tolerating  t collar Hx of tobacco abuse. bilat effusions - lt thora 6/12 - lymphocytic exudate Plan: -advance ATC as tolerated   Sigmoid colectomy, colostomy, Hartmann pouch with open wound and wound vac. Plan: - wound care, nutrition per primary team -vigilance for new infections , while onTNA  Hx of ESRD, DM type II, HTN, GERD, A flutter. S/p partial thyroidectomy, parathyroidectomy 10/07/14. Plan: - per primary team, cards following a flutter - for maximizing neg balance, see pulm  Delerium -  IV haldol prn  and taper klonopin to off  -ct seroquel for now  [facility] Minor ACNP [name] PCCM Pager [phone] till 3 pm If no answer page [phone] 11/03/2014, 10:47 AM  Attending note:  54 year old male with respiratory failure following abdominal surgery and prolonged hospitalization. I reviewed the CXR myself, evidence of pleural effusion but improving. Discussed with SSH-MD, PCCM-MD and RT bedside. Continue weaning efforts.  Acute respiratory failure due to deconditioning and prolonged hospitalization. - Continue weaning as able, TC with target of 20 hours. - Pulmonary hygienes. - Titrate O2 down as able for target sat of 88-92%. - Do not decannulate or cap trach.  Tracheostomy status - No decannulation while delirious and deconditioned.  - Do not cap trach.  Sigmoid  colectomy, colostomy, Hartmann pouch with open wound and wound vac. - Wound care, nutrition per primary team - Vigilance for new infections , while onTNA  Delirium:  - IV haldol as ordered. - Taper Klonopin to off as able. - Continue seroquel for now.  Deconditioning: - Continue PT efforts but patient is not cooperative and that is creating a great deal of difficulty.  Protein calorie malnutrition - Continue  TPN - Will need to discuss when able to use GI tract.  Patient seen and examined, agree with above note. I dictated the care and orders written for this patient under my direction.  Alyson ReedyWesam G Rashawnda Gaba, MD [phone]

## 2014-11-04 ENCOUNTER — Other Ambulatory Visit (HOSPITAL_COMMUNITY): Payer: Self-pay

## 2014-11-04 LAB — RENAL FUNCTION PANEL
ANION GAP: 14 (ref 5–15)
Albumin: 2.7 g/dL — ABNORMAL LOW (ref 3.5–5.0)
BUN: 51 mg/dL — ABNORMAL HIGH (ref 6–20)
CHLORIDE: 97 mmol/L — AB (ref 101–111)
CO2: 27 mmol/L (ref 22–32)
CREATININE: 5.57 mg/dL — AB (ref 0.61–1.24)
Calcium: 9.6 mg/dL (ref 8.9–10.3)
GFR calc Af Amer: 12 mL/min — ABNORMAL LOW (ref >60)
GFR calc non Af Amer: 11 mL/min — ABNORMAL LOW (ref >60)
GLUCOSE: 88 mg/dL (ref 65–99)
POTASSIUM: 5.1 mmol/L (ref 3.5–5.1)
Phosphorus: 4.1 mg/dL (ref 2.5–4.6)
Sodium: 138 mmol/L (ref 135–145)

## 2014-11-04 LAB — CBC
HCT: 25.8 % — ABNORMAL LOW (ref 39.0–52.0)
Hemoglobin: 8 g/dL — ABNORMAL LOW (ref 13.0–17.0)
MCH: 27.1 pg (ref 26.0–34.0)
MCHC: 31 g/dL (ref 30.0–36.0)
MCV: 87.5 fL (ref 78.0–100.0)
PLATELETS: 127 10*3/uL — AB (ref 150–400)
RBC: 2.95 MIL/uL — AB (ref 4.22–5.81)
RDW: 18.6 % — ABNORMAL HIGH (ref 11.5–15.5)
WBC: 7.7 10*3/uL (ref 4.0–10.5)

## 2014-11-04 LAB — COMPREHENSIVE METABOLIC PANEL
ALK PHOS: 154 U/L — AB (ref 38–126)
ALT: 9 U/L — ABNORMAL LOW (ref 17–63)
ANION GAP: 8 (ref 5–15)
AST: 22 U/L (ref 15–41)
Albumin: 2.7 g/dL — ABNORMAL LOW (ref 3.5–5.0)
BILIRUBIN TOTAL: 1.7 mg/dL — AB (ref 0.3–1.2)
BUN: 18 mg/dL (ref 6–20)
CHLORIDE: 99 mmol/L — AB (ref 101–111)
CO2: 32 mmol/L (ref 22–32)
Calcium: 9 mg/dL (ref 8.9–10.3)
Creatinine, Ser: 2.91 mg/dL — ABNORMAL HIGH (ref 0.61–1.24)
GFR calc Af Amer: 27 mL/min — ABNORMAL LOW (ref >60)
GFR, EST NON AFRICAN AMERICAN: 23 mL/min — AB (ref >60)
Glucose, Bld: 115 mg/dL — ABNORMAL HIGH (ref 65–99)
Potassium: 3.4 mmol/L — ABNORMAL LOW (ref 3.5–5.1)
SODIUM: 139 mmol/L (ref 135–145)
Total Protein: 7.3 g/dL (ref 6.5–8.1)

## 2014-11-04 NOTE — Progress Notes (Signed)
Subjective:  Pt underwent dialysis today. Resting comfortably in bed at the moment.  Remains on the vent. bfr 400 dfr 800 1500 cc removed  Objective:  Vital signs in last 24 hours:  98.1  19  108  108/69 Vent 30%, peep 5   Weight change:  There were no vitals filed for this visit.   Physical Exam: General: On the vent, NAD  HEENT [place]/AT OM moist, NG tube in place  Neck Trach in place  Pulm/lungs Bilateral rhonchi,  Vent   CVS/Heart S1S2 irregular  Abdomen:  Distended, BS present, wound vac in place, colosotomy in place,   Extremities: 1+ pitting edema  Neurologic: Lethargic, arousable  Skin: No acute rashes  Access: Left radiocephalic AVF       Basic Metabolic Panel:  Recent Labs Lab 10/30/14 0602 11/02/14 0535 11/02/14 0830 11/04/14 0630  NA 132*  132* 135 136 138  K 4.7  4.7 4.0 4.3 5.1  CL 96*  96* 96* 95* 97*  CO2 GLUCOSE 93  92 98 97 88  BUN 57*  57* 52* 54* 51*  CREATININE 5.33*  5.31* 6.21* 6.39* 5.57*  CALCIUM 9.3  9.3 9.3 9.5 9.6  MG  --  2.6*  --   --   PHOS 3.6 4.3 4.6 4.1     CBC:  Recent Labs Lab 10/30/14 0602 10/31/14 0450 11/02/14 0535 11/02/14 0830 11/04/14 0630  WBC 7.2 4.3 4.4 4.5 7.7  HGB 7.9* 7.3* 7.1* 7.5* 8.0*  HCT 25.2* 23.0* 22.2* 24.1* 25.8*  MCV 85.4 85.8 85.1 85.5 87.5  PLT 173 172 163 159 127*      Microbiology: Results for orders placed or performed during the hospital encounter of 10/14/14  Culture, blood (routine x 2)     Status: None   Collection Time: 10/16/14  9:16 AM  Result Value Ref Range Status   Specimen Description BLOOD PICC LINE  Final   Special Requests BOTTLES DRAWN AEROBIC AND ANAEROBIC  Final   Culture   Final    NO GROWTH 5 DAYS Performed at [facility]    Report Status 10/22/2014 FINAL  Final  Culture, blood (routine x 2)     Status: None   Collection Time: 10/16/14 10:25 AM  Result Value Ref Range Status   Specimen Description BLOOD RIGHT HAND   Final   Special Requests BOTTLES DRAWN AEROBIC ONLY 10CC  Final   Culture   Final    NO GROWTH 5 DAYS Performed at [facility]    Report Status 10/22/2014 FINAL  Final  Culture, blood (routine x 2)     Status: None   Collection Time: 10/16/14 10:53 PM  Result Value Ref Range Status   Specimen Description BLOOD RIGHT ANTECUBITAL  Final   Special Requests BOTTLES DRAWN AEROBIC AND ANAEROBIC 10CC  Final   Culture   Final    NO GROWTH 5 DAYS Performed at [facility]    Report Status 10/23/2014 FINAL  Final  Culture, blood (routine x 2)     Status: None   Collection Time: 10/16/14 11:00 PM  Result Value Ref Range Status   Specimen Description BLOOD PICC LINE  Final   Special Requests BOTTLES DRAWN AEROBIC AND ANAEROBIC 10 CC  Final   Culture   Final    NO GROWTH 5 DAYS Performed at [facility]    Report Status 10/23/2014 FINAL  Final  Culture, expectorated sputum-assessment  Status: None   Collection Time: 10/17/14  6:15 AM  Result Value Ref Range Status   Specimen Description SPUTUM  Final   Special Requests NONE  Final   Sputum evaluation   Final    THIS SPECIMEN IS ACCEPTABLE. RESPIRATORY CULTURE REPORT TO FOLLOW.   Report Status 10/17/2014 FINAL  Final  Culture, respiratory (NON-Expectorated)     Status: None   Collection Time: 10/17/14  8:00 AM  Result Value Ref Range Status   Specimen Description SPUTUM  Final   Special Requests NONE  Final   Gram Stain   Final    NO WBC SEEN NO SQUAMOUS EPITHELIAL CELLS SEEN RARE GRAM POSITIVE COCCI IN PAIRS Performed at [facility]    Culture   Final    NORMAL OROPHARYNGEAL FLORA Performed at [facility]    Report Status 10/19/2014 FINAL  Final  Body fluid culture     Status: None   Collection Time: 10/18/14 10:07 AM  Result Value Ref Range Status   Specimen Description PLEURAL LEFT FLUID  Final   Special Requests NONE  Final   Gram Stain   Final    NO WBC SEEN NO ORGANISMS  SEEN Performed at [facility]    Culture   Final    NO GROWTH 3 DAYS Performed at [facility]    Report Status 10/22/2014 FINAL  Final  Culture, body fluid-bottle     Status: None   Collection Time: 10/23/14  9:39 AM  Result Value Ref Range Status   Specimen Description FLUID BILE  Final   Special Requests NONE  Final   Culture NO GROWTH 5 DAYS  Final   Report Status 10/28/2014 FINAL  Final  Gram stain     Status: None   Collection Time: 10/23/14  9:39 AM  Result Value Ref Range Status   Specimen Description FLUID BILE  Final   Special Requests NONE  Final   Gram Stain NO WBC SEEN NO ORGANISMS SEEN   Final   Report Status 10/23/2014 FINAL  Final  Clostridium Difficile by PCR (not at [facility])     Status: None   Collection Time: 10/29/14  4:59 AM  Result Value Ref Range Status   C difficile by pcr NEGATIVE NEGATIVE Final    Coagulation Studies: No results for input(s): LABPROT, INR in the last 72 hours.  Urinalysis: No results for input(s): COLORURINE, LABSPEC, PHURINE, GLUCOSEU, HGBUR, BILIRUBINUR, KETONESUR, PROTEINUR, UROBILINOGEN, NITRITE, LEUKOCYTESUR in the last 72 hours.  Invalid input(s): APPERANCEUR    Imaging: No results found.   Medications:       Assessment/ Plan:  54 y.o. male  with  ESRD, HTN, CHF, Previous alcohol abuse, tobacco abse, , [facility] with resp failure. He is s/p Ex Lap, sigmoid colectomy and colostomy placement. He has a wound vac. [facility] in [place], [place]  1. ESRD - Pt had HD today. Tolerated well.   - 1500 cc removed  2. AOCKD - hgb 8.0, continue aranesp and monitor hgb.  3. SHPTH - phos 4.1 and acceptable, will continue to monitor.  4. Abdominal distention  - s/p paracentesis.  5.  Acute respiratory failure:   Vent today  Harold Mayo 6/29/20163:16 PM

## 2014-11-05 NOTE — Progress Notes (Signed)
Name: Harold Mayo MRN: [id number] DOB: 08/08/1960    ADMISSION DATE:  10/14/2014 CONSULTATION DATE: 10/15/2014  [facility]  CHIEF COMPLAINT:  Resp failure  BRIEF PATIENT DESCRIPTION:  54 yo male [facility] 09[facility]10/14 with abdominal pain.  Had laparotomy, colon resection and abscess drainage.  Developed respiratory failure with failure to wean from vent and required tracheostomy 6/01.  [facility] care, rehab, and vent weaning.  SUBJECTIVE:  Weaning on ATC,but sob 6/29  VITAL SIGNS: 109/69 105 18 100% 99.3  PHYSICAL EXAMINATION: General: uncomfortable on t collar Neuro:   Rass 1 follow simple commands HEENT: Tracheostomy 8 XLT Cardiovascular: HSR RRR Lungs: redcued Abdomen:  Open wound with mid line vac, colostomy Musculoskeletal:  intact Skin: warm and dry    CMP Latest Ref Rng 11/04/2014 11/04/2014 11/02/2014  Glucose 65 - 99 mg/dL 478(G115(H) 88 97  BUN 6 - 20 mg/dL 18 95(A51(H) 21(H54(H)  Creatinine 0.61 - 1.24 mg/dL 0.86(V2.91(H) 7.84(O5.57(H) 9.62(X6.39(H)  Sodium 135 - 145 mmol/L 139 138 136  Potassium 3.5 - 5.1 mmol/L 3.4(L) 5.1 4.3  Chloride 101 - 111 mmol/L 99(L) 97(L) 95(L)  CO2 22 - 32 mmol/L 32 27 31  Calcium 8.9 - 10.3 mg/dL 9.0 9.6 9.5  Total Protein 6.5 - 8.1 g/dL 7.3 - -  Total Bilirubin 0.3 - 1.2 mg/dL 5.2(W1.7(H) - -  Alkaline Phos 38 - 126 U/L 154(H) - -  AST 15 - 41 U/L 22 - -  ALT 17 - 63 U/L 9(L) - -    CBC Latest Ref Rng 11/04/2014 11/02/2014 11/02/2014  WBC 4.0 - 10.5 K/uL 7.7 4.5 4.4  Hemoglobin 13.0 - 17.0 g/dL 8.0(L) 7.5(L) 7.1(L)  Hematocrit 39.0 - 52.0 % 25.8(L) 24.1(L) 22.2(L)  Platelets 150 - 400 K/uL 127(L) 159 163    ABG    Component Value Date/Time   PHART 7.390 10/28/2014 2339   PCO2ART 45.5* 10/28/2014 2339   PO2ART 191* 10/28/2014 2339   HCO3 26.9* 10/28/2014 2339   TCO2 28.3 10/28/2014 2339   ACIDBASEDEF 0.6 10/16/2014 0500   O2SAT 99.6 10/28/2014 2339    No results found.   ASSESSMENT    Currently tolerating  t collar Hx of tobacco abuse. bilat effusions - lt thora 6/12 - lymphocytic exudate Plan: -advance ATC as tolerated   Sigmoid colectomy, colostomy, Hartmann pouch with open wound and wound vac. Plan: - wound care, nutrition per primary team -vigilance for new infections , while onTNA  Hx of ESRD, DM type II, HTN, GERD, A flutter. S/p partial thyroidectomy, parathyroidectomy 10/07/14. Plan: - per primary team, cards following a flutter - for maximizing neg balance, see pulm  Delerium -  IV haldol prn  and taper klonopin to off  -ct seroquel for now  [facility] Pager [phone] till 3 pm If no answer page [phone] 11/05/2014, 8:46 AM  Attending note:  54 year old male with respiratory failure following abdominal surgery and prolonged hospitalization. I reviewed the CXR myself, evidence of pleural effusion but improving. Discussed with SSH-MD, PCCM-MD and RT bedside. Continue weaning efforts.  Acute respiratory failure due to deconditioning and prolonged hospitalization. - TC for 24 hrs at this point.  - Titrate O2 for sats. - Pulmonary hygienes. - Titrate O2 down as able for target sat of 88-92%. - Do not decannulate or cap trach given increased secretion.  Tracheostomy status - No decannulation while delirious and deconditioned. - Do not cap  trach.  Sigmoid colectomy, colostomy, Hartmann pouch with open wound and wound vac. - Wound care, nutrition per primary team - Vigilance for new infections , while onTNA  Delirium:  - IV haldol as ordered. - Taper Klonopin to off as able. - Continue seroquel for now.  Deconditioning: - Continue PT efforts but patient is not cooperative and that is creating a great deal of difficulty.  Protein calorie malnutrition - Continue  TPN - Will need to discuss when able to use GI tract.  Patient seen and examined, agree with above note. I dictated the care and orders written for this patient under my direction.  Alyson Reedy, MD [phone]

## 2014-11-06 LAB — CBC
HCT: 25.3 % — ABNORMAL LOW (ref 39.0–52.0)
HEMOGLOBIN: 7.5 g/dL — AB (ref 13.0–17.0)
MCH: 25.9 pg — AB (ref 26.0–34.0)
MCHC: 29.6 g/dL — AB (ref 30.0–36.0)
MCV: 87.2 fL (ref 78.0–100.0)
Platelets: 187 10*3/uL (ref 150–400)
RBC: 2.9 MIL/uL — AB (ref 4.22–5.81)
RDW: 18.8 % — AB (ref 11.5–15.5)
WBC: 4.9 10*3/uL (ref 4.0–10.5)

## 2014-11-06 LAB — RENAL FUNCTION PANEL
Albumin: 2.8 g/dL — ABNORMAL LOW (ref 3.5–5.0)
Anion gap: 11 (ref 5–15)
BUN: 43 mg/dL — ABNORMAL HIGH (ref 6–20)
CALCIUM: 9.5 mg/dL (ref 8.9–10.3)
CO2: 30 mmol/L (ref 22–32)
Chloride: 97 mmol/L — ABNORMAL LOW (ref 101–111)
Creatinine, Ser: 4.82 mg/dL — ABNORMAL HIGH (ref 0.61–1.24)
GFR calc Af Amer: 15 mL/min — ABNORMAL LOW (ref >60)
GFR calc non Af Amer: 13 mL/min — ABNORMAL LOW (ref >60)
GLUCOSE: 113 mg/dL — AB (ref 65–99)
Phosphorus: 4.7 mg/dL — ABNORMAL HIGH (ref 2.5–4.6)
Potassium: 4.1 mmol/L (ref 3.5–5.1)
SODIUM: 138 mmol/L (ref 135–145)

## 2014-11-06 NOTE — Progress Notes (Signed)
Subjective:  Pt for  Dialysis later today. Resting comfortably in bed at the moment.     Objective:  Vital signs in last 24 hours:  91/63  113  27     Weight change:  There were no vitals filed for this visit.   Physical Exam: General:  NAD  HEENT [place]/AT OM moist, NG tube in place  Neck Trach in place, PM valve  Pulm/lungs Clear today  CVS/Heart S1S2 irregular  Abdomen:  Distended, BS present, wound vac in place, colosotomy in place,   Extremities: 1+ pitting edema  Neurologic: Alert, answering questions, communicating   Skin: No acute rashes  Access: Left radiocephalic AVF       Basic Metabolic Panel:  Recent Labs Lab 11/02/14 0535 11/02/14 0830 11/04/14 0630 11/04/14 1511 11/06/14 0555  NA 135 136 138 139 138  K 4.0 4.3 5.1 3.4* 4.1  CL 96* 95* 97* 99* 97*  CO2 28 31 27  32 30  GLUCOSE 98 97 88 115* 113*  BUN 52* 54* 51* 18 43*  CREATININE 6.21* 6.39* 5.57* 2.91* 4.82*  CALCIUM 9.3 9.5 9.6 9.0 9.5  MG 2.6*  --   --   --   --   PHOS 4.3 4.6 4.1  --  4.7*     CBC:  Recent Labs Lab 10/31/14 0450 11/02/14 0535 11/02/14 0830 11/04/14 0630 11/06/14 0555  WBC 4.3 4.4 4.5 7.7 4.9  HGB 7.3* 7.1* 7.5* 8.0* 7.5*  HCT 23.0* 22.2* 24.1* 25.8* 25.3*  MCV 85.8 85.1 85.5 87.5 87.2  PLT 172 163 159 127* 187      Microbiology: Results for orders placed or performed during the hospital encounter of 10/14/14  Culture, blood (routine x 2)     Status: None   Collection Time: 10/16/14  9:16 AM  Result Value Ref Range Status   Specimen Description BLOOD PICC LINE  Final   Special Requests BOTTLES DRAWN AEROBIC AND ANAEROBIC 10ML  Final   Culture   Final    NO GROWTH 5 DAYS Performed at [facility]    Report Status 10/22/2014 FINAL  Final  Culture, blood (routine x 2)     Status: None   Collection Time: 10/16/14 10:25 AM  Result Value Ref Range Status   Specimen Description BLOOD RIGHT HAND  Final   Special Requests BOTTLES DRAWN AEROBIC ONLY 10CC   Final   Culture   Final    NO GROWTH 5 DAYS Performed at [facility]    Report Status 10/22/2014 FINAL  Final  Culture, blood (routine x 2)     Status: None   Collection Time: 10/16/14 10:53 PM  Result Value Ref Range Status   Specimen Description BLOOD RIGHT ANTECUBITAL  Final   Special Requests BOTTLES DRAWN AEROBIC AND ANAEROBIC 10CC  Final   Culture   Final    NO GROWTH 5 DAYS Performed at [facility]    Report Status 10/23/2014 FINAL  Final  Culture, blood (routine x 2)     Status: None   Collection Time: 10/16/14 11:00 PM  Result Value Ref Range Status   Specimen Description BLOOD PICC LINE  Final   Special Requests BOTTLES DRAWN AEROBIC AND ANAEROBIC 10 CC  Final   Culture   Final    NO GROWTH 5 DAYS Performed at [facility]    Report Status 10/23/2014 FINAL  Final  Culture, expectorated sputum-assessment     Status: None   Collection Time: 10/17/14  6:15 AM  Result Value Ref Range Status   Specimen Description SPUTUM  Final   Special Requests NONE  Final   Sputum evaluation   Final    THIS SPECIMEN IS ACCEPTABLE. RESPIRATORY CULTURE REPORT TO FOLLOW.   Report Status 10/17/2014 FINAL  Final  Culture, respiratory (NON-Expectorated)     Status: None   Collection Time: 10/17/14  8:00 AM  Result Value Ref Range Status   Specimen Description SPUTUM  Final   Special Requests NONE  Final   Gram Stain   Final    NO WBC SEEN NO SQUAMOUS EPITHELIAL CELLS SEEN RARE GRAM POSITIVE COCCI IN PAIRS Performed at [facility]    Culture   Final    NORMAL OROPHARYNGEAL FLORA Performed at [facility]    Report Status 10/19/2014 FINAL  Final  Body fluid culture     Status: None   Collection Time: 10/18/14 10:07 AM  Result Value Ref Range Status   Specimen Description PLEURAL LEFT FLUID  Final   Special Requests NONE  Final   Gram Stain   Final    NO WBC SEEN NO ORGANISMS SEEN Performed at [facility]    Culture   Final     NO GROWTH 3 DAYS Performed at [facility]    Report Status 10/22/2014 FINAL  Final  Culture, body fluid-bottle     Status: None   Collection Time: 10/23/14  9:39 AM  Result Value Ref Range Status   Specimen Description FLUID BILE  Final   Special Requests NONE  Final   Culture NO GROWTH 5 DAYS  Final   Report Status 10/28/2014 FINAL  Final  Gram stain     Status: None   Collection Time: 10/23/14  9:39 AM  Result Value Ref Range Status   Specimen Description FLUID BILE  Final   Special Requests NONE  Final   Gram Stain NO WBC SEEN NO ORGANISMS SEEN   Final   Report Status 10/23/2014 FINAL  Final  Clostridium Difficile by PCR (not at [facility])     Status: None   Collection Time: 10/29/14  4:59 AM  Result Value Ref Range Status   C difficile by pcr NEGATIVE NEGATIVE Final    Coagulation Studies: No results for input(s): LABPROT, INR in the last 72 hours.  Urinalysis: No results for input(s): COLORURINE, LABSPEC, PHURINE, GLUCOSEU, HGBUR, BILIRUBINUR, KETONESUR, PROTEINUR, UROBILINOGEN, NITRITE, LEUKOCYTESUR in the last 72 hours.  Invalid input(s): APPERANCEUR    Imaging: No results found.   Medications:    Aranesp  200 mcg weekly   Assessment/ Plan:  54 y.o. male  with  ESRD, HTN, CHF, Previous alcohol abuse, tobacco abse, , was admitted to [facility] on 10/14/2014 with resp failure. He is s/p Ex Lap, sigmoid colectomy and colostomy placement. He has a wound vac. Regular dialysis at [facility] center in [place], [place]  1. ESRD - HD later today  2. AOCKD - hgb 7.5, continue aranesp and monitor hgb.  3. SHPTH - phos 4.7 and acceptable, will continue to monitor.  4. Abdominal distention  - s/p paracentesis.  5.  Acute respiratory failure:   Talking, PM valve in place  [facility] 7/1/20168:15 AM

## 2014-11-07 ENCOUNTER — Other Ambulatory Visit (HOSPITAL_COMMUNITY): Payer: Self-pay

## 2014-11-08 ENCOUNTER — Other Ambulatory Visit (HOSPITAL_COMMUNITY): Payer: Self-pay

## 2014-11-08 LAB — BLOOD GAS, ARTERIAL
Acid-Base Excess: 2.9 mmol/L — ABNORMAL HIGH (ref 0.0–2.0)
BICARBONATE: 27.5 meq/L — AB (ref 20.0–24.0)
FIO2: 0.28 %
O2 Saturation: 94.5 %
PATIENT TEMPERATURE: 98.3
PCO2 ART: 46 mmHg — AB (ref 35.0–45.0)
PO2 ART: 77.4 mmHg — AB (ref 80.0–100.0)
TCO2: 28.9 mmol/L (ref 0–100)
pH, Arterial: 7.393 (ref 7.350–7.450)

## 2014-11-09 ENCOUNTER — Other Ambulatory Visit (HOSPITAL_COMMUNITY): Payer: Self-pay

## 2014-11-09 LAB — COMPREHENSIVE METABOLIC PANEL
ALK PHOS: 122 U/L (ref 38–126)
ALT: 12 U/L — ABNORMAL LOW (ref 17–63)
AST: 20 U/L (ref 15–41)
Albumin: 2.7 g/dL — ABNORMAL LOW (ref 3.5–5.0)
Anion gap: 11 (ref 5–15)
BILIRUBIN TOTAL: 1.9 mg/dL — AB (ref 0.3–1.2)
BUN: 61 mg/dL — ABNORMAL HIGH (ref 6–20)
CO2: 29 mmol/L (ref 22–32)
CREATININE: 6.37 mg/dL — AB (ref 0.61–1.24)
Calcium: 9.2 mg/dL (ref 8.9–10.3)
Chloride: 95 mmol/L — ABNORMAL LOW (ref 101–111)
GFR calc Af Amer: 10 mL/min — ABNORMAL LOW (ref >60)
GFR calc non Af Amer: 9 mL/min — ABNORMAL LOW (ref >60)
Glucose, Bld: 120 mg/dL — ABNORMAL HIGH (ref 65–99)
Potassium: 5.1 mmol/L (ref 3.5–5.1)
Sodium: 135 mmol/L (ref 135–145)
TOTAL PROTEIN: 7.7 g/dL (ref 6.5–8.1)

## 2014-11-09 LAB — PREPARE RBC (CROSSMATCH)

## 2014-11-09 LAB — TRIGLYCERIDES: TRIGLYCERIDES: 75 mg/dL (ref <150)

## 2014-11-09 LAB — CBC
HEMATOCRIT: 21.7 % — AB (ref 39.0–52.0)
Hemoglobin: 6.9 g/dL — CL (ref 13.0–17.0)
MCH: 27.2 pg (ref 26.0–34.0)
MCHC: 31.8 g/dL (ref 30.0–36.0)
MCV: 85.4 fL (ref 78.0–100.0)
Platelets: 177 10*3/uL (ref 150–400)
RBC: 2.54 MIL/uL — ABNORMAL LOW (ref 4.22–5.81)
RDW: 18.7 % — ABNORMAL HIGH (ref 11.5–15.5)
WBC: 7.6 10*3/uL (ref 4.0–10.5)

## 2014-11-09 LAB — PHOSPHORUS: Phosphorus: 5.4 mg/dL — ABNORMAL HIGH (ref 2.5–4.6)

## 2014-11-09 LAB — ABO/RH: ABO/RH(D): O POS

## 2014-11-09 LAB — MAGNESIUM: MAGNESIUM: 2.5 mg/dL — AB (ref 1.7–2.4)

## 2014-11-09 NOTE — Progress Notes (Signed)
Subjective:  Patient seen during dialysis today. Blood flow rate 400. Remains on the ventilator at this time.   Objective:  Vital signs in last 24 hours:  99.8 101 23 11746    Physical Exam: General: On the vent, NAD  HEENT [place]/AT OM moist, NG tube in place  Neck Trach in place  Pulm/lungs Bilateral rhonchi,  Vent assisted  CVS/Heart S1S2 irregular  Abdomen:  Distended, BS present, colosotomy in place,   Extremities: Trace b/l LE edema  Neurologic: Awake, alert, follows simple commands  Skin: No acute rashes  Access: Left radiocephalic AVF       Basic Metabolic Panel:  Recent Labs Lab 11/04/14 0630 11/04/14 1511 11/06/14 0555 11/09/14 0500 11/09/14 0519  NA 138 139 138 135  --   K 5.1 3.4* 4.1 5.1  --   CL 97* 99* 97* 95*  --   CO2 27 32 30 29  --   GLUCOSE 88 115* 113* 120*  --   BUN 51* 18 43* 61*  --   CREATININE 5.57* 2.91* 4.82* 6.37*  --   CALCIUM 9.6 9.0 9.5 9.2  --   MG  --   --   --   --  2.5*  PHOS 4.1  --  4.7*  --  5.4*     CBC:  Recent Labs Lab 11/04/14 0630 11/06/14 0555 11/09/14 0519  WBC 7.7 4.9 7.6  HGB 8.0* 7.5* 6.9*  HCT 25.8* 25.3* 21.7*  MCV 87.5 87.2 85.4  PLT 127* 187 177      Microbiology: Results for orders placed or performed during the hospital encounter of 10/14/14  Culture, blood (routine x 2)     Status: None   Collection Time: 10/16/14  9:16 AM  Result Value Ref Range Status   Specimen Description BLOOD PICC LINE  Final   Special Requests BOTTLES DRAWN AEROBIC AND ANAEROBIC 10ML  Final   Culture   Final    NO GROWTH 5 DAYS Performed at [facility]    Report Status 10/22/2014 FINAL  Final  Culture, blood (routine x 2)     Status: None   Collection Time: 10/16/14 10:25 AM  Result Value Ref Range Status   Specimen Description BLOOD RIGHT HAND  Final   Special Requests BOTTLES DRAWN AEROBIC ONLY 10CC  Final   Culture   Final    NO GROWTH 5 DAYS Performed at [facility]    Report Status  10/22/2014 FINAL  Final  Culture, blood (routine x 2)     Status: None   Collection Time: 10/16/14 10:53 PM  Result Value Ref Range Status   Specimen Description BLOOD RIGHT ANTECUBITAL  Final   Special Requests BOTTLES DRAWN AEROBIC AND ANAEROBIC 10CC  Final   Culture   Final    NO GROWTH 5 DAYS Performed at [facility]    Report Status 10/23/2014 FINAL  Final  Culture, blood (routine x 2)     Status: None   Collection Time: 10/16/14 11:00 PM  Result Value Ref Range Status   Specimen Description BLOOD PICC LINE  Final   Special Requests BOTTLES DRAWN AEROBIC AND ANAEROBIC 10 CC  Final   Culture   Final    NO GROWTH 5 DAYS Performed at [facility]    Report Status 10/23/2014 FINAL  Final  Culture, expectorated sputum-assessment     Status: None   Collection Time: 10/17/14  6:15 AM  Result Value Ref Range Status  Specimen Description SPUTUM  Final   Special Requests NONE  Final   Sputum evaluation   Final    THIS SPECIMEN IS ACCEPTABLE. RESPIRATORY CULTURE REPORT TO FOLLOW.   Report Status 10/17/2014 FINAL  Final  Culture, respiratory (NON-Expectorated)     Status: None   Collection Time: 10/17/14  8:00 AM  Result Value Ref Range Status   Specimen Description SPUTUM  Final   Special Requests NONE  Final   Gram Stain   Final    NO WBC SEEN NO SQUAMOUS EPITHELIAL CELLS SEEN RARE GRAM POSITIVE COCCI IN PAIRS Performed at [facility]    Culture   Final    NORMAL OROPHARYNGEAL FLORA Performed at [facility]    Report Status 10/19/2014 FINAL  Final  Body fluid culture     Status: None   Collection Time: 10/18/14 10:07 AM  Result Value Ref Range Status   Specimen Description PLEURAL LEFT FLUID  Final   Special Requests NONE  Final   Gram Stain   Final    NO WBC SEEN NO ORGANISMS SEEN Performed at [facility]    Culture   Final    NO GROWTH 3 DAYS Performed at [facility]    Report Status 10/22/2014 FINAL  Final   Culture, body fluid-bottle     Status: None   Collection Time: 10/23/14  9:39 AM  Result Value Ref Range Status   Specimen Description FLUID BILE  Final   Special Requests NONE  Final   Culture NO GROWTH 5 DAYS  Final   Report Status 10/28/2014 FINAL  Final  Gram stain     Status: None   Collection Time: 10/23/14  9:39 AM  Result Value Ref Range Status   Specimen Description FLUID BILE  Final   Special Requests NONE  Final   Gram Stain NO WBC SEEN NO ORGANISMS SEEN   Final   Report Status 10/23/2014 FINAL  Final  Clostridium Difficile by PCR (not at [facility])     Status: None   Collection Time: 10/29/14  4:59 AM  Result Value Ref Range Status   C difficile by pcr NEGATIVE NEGATIVE Final  Culture, respiratory (NON-Expectorated)     Status: None (Preliminary result)   Collection Time: 11/07/14  6:01 PM  Result Value Ref Range Status   Specimen Description TRACHEAL ASPIRATE  Final   Special Requests NONE  Final   Gram Stain   Final    RARE WBC PRESENT,BOTH PMN AND MONONUCLEAR NO SQUAMOUS EPITHELIAL CELLS SEEN ABUNDANT GRAM NEGATIVE RODS Performed at [facility]   Final    Culture reincubated for better growth Performed at [facility]    Report Status PENDING  Incomplete    Coagulation Studies: No results for input(s): LABPROT, INR in the last 72 hours.  Urinalysis: No results for input(s): COLORURINE, LABSPEC, PHURINE, GLUCOSEU, HGBUR, BILIRUBINUR, KETONESUR, PROTEINUR, UROBILINOGEN, NITRITE, LEUKOCYTESUR in the last 72 hours.  Invalid input(s): APPERANCEUR    Imaging: Dg Abd Portable 1v  11/09/2014   CLINICAL DATA:  Ileus  EXAM: PORTABLE ABDOMEN - 1 VIEW  COMPARISON:  11/08/2014  FINDINGS: Right nephrostomy catheter remains in place, unchanged. Decreasing gaseous distention of bowel. No evidence of obstruction. No free air. No suspicious calcification.  IMPRESSION: Decreasing gaseous distention of bowel.   Electronically Signed   By: Charlett Nose M.D.   On: 11/09/2014 07:22   Dg Abd Portable 1v  11/08/2014  CLINICAL DATA:  Small bowel obstruction.  EXAM: PORTABLE ABDOMEN - 1 VIEW  COMPARISON:  Abdominal CT 10/30/2014  FINDINGS: Gaseous distension of small and large bowel without transition to suggest mechanical obstruction. Haziness of the upper abdomen correlates with ascites seen on the previous study. Cholecystostomy tube is again noted. Haziness of the lower chest, consistent with known atelectasis and pleural fluid. Cardiomegaly. Ostomy noted on the left abdominal wall, a descending colostomy.  IMPRESSION: Diffuse gaseous distention of bowel which could reflect low grade ileus.   Electronically Signed   By: Marnee Spring M.D.   On: 11/08/2014 22:10     Medications:       Assessment/ Plan:  54 y.o. male  with  ESRD, HTN, CHF, Previous alcohol abuse, tobacco abse, , was [facility] to [facility] from [facility] on 10/14/2014 with resp failure. He is s/p Ex Lap, sigmoid colectomy and colostomy placement. He has a wound vac. Regular dialysis at [facility] center in [place], [place]  1. ESRD - Pt seen during HD today, tolerating well, complete HD today, next HD on Wednesday.  2. AOCKD - hemoglobin down to 6.9. Continue Aranesp. Consider transfusion for hemoglobin of 7 or less. Deferred the hospitalist.  3. SHPTH - phosphorus 5.4 and is acceptable.  4. Abdominal distention  - s/p paracentesis.  5.  Acute respiratory failure:   Patient remains on the ventilator. FiO2 30%. Weaning per pulmonary critical care. Ultrafiltration target is 3.5 kg today.  Marisol Giambra 7/4/20161:26 PM

## 2014-11-10 ENCOUNTER — Other Ambulatory Visit (HOSPITAL_COMMUNITY): Payer: Self-pay

## 2014-11-11 ENCOUNTER — Other Ambulatory Visit (HOSPITAL_COMMUNITY): Payer: Self-pay

## 2014-11-11 LAB — RENAL FUNCTION PANEL
Albumin: 2.6 g/dL — ABNORMAL LOW (ref 3.5–5.0)
Anion gap: 9 (ref 5–15)
BUN: 54 mg/dL — AB (ref 6–20)
CO2: 31 mmol/L (ref 22–32)
CREATININE: 5.42 mg/dL — AB (ref 0.61–1.24)
Calcium: 9.5 mg/dL (ref 8.9–10.3)
Chloride: 98 mmol/L — ABNORMAL LOW (ref 101–111)
GFR calc Af Amer: 13 mL/min — ABNORMAL LOW (ref >60)
GFR calc non Af Amer: 11 mL/min — ABNORMAL LOW (ref >60)
GLUCOSE: 111 mg/dL — AB (ref 65–99)
Phosphorus: 6.1 mg/dL — ABNORMAL HIGH (ref 2.5–4.6)
Potassium: 4.9 mmol/L (ref 3.5–5.1)
Sodium: 138 mmol/L (ref 135–145)

## 2014-11-11 LAB — CBC
HCT: 22.2 % — ABNORMAL LOW (ref 39.0–52.0)
HEMOGLOBIN: 6.8 g/dL — AB (ref 13.0–17.0)
MCH: 27.5 pg (ref 26.0–34.0)
MCHC: 30.6 g/dL (ref 30.0–36.0)
MCV: 89.9 fL (ref 78.0–100.0)
Platelets: 247 10*3/uL (ref 150–400)
RBC: 2.47 MIL/uL — ABNORMAL LOW (ref 4.22–5.81)
RDW: 19.9 % — AB (ref 11.5–15.5)
WBC: 6.4 10*3/uL (ref 4.0–10.5)

## 2014-11-11 LAB — BASIC METABOLIC PANEL
ANION GAP: 8 (ref 5–15)
BUN: 54 mg/dL — AB (ref 6–20)
CO2: 32 mmol/L (ref 22–32)
CREATININE: 5.48 mg/dL — AB (ref 0.61–1.24)
Calcium: 9.5 mg/dL (ref 8.9–10.3)
Chloride: 98 mmol/L — ABNORMAL LOW (ref 101–111)
GFR, EST AFRICAN AMERICAN: 13 mL/min — AB (ref >60)
GFR, EST NON AFRICAN AMERICAN: 11 mL/min — AB (ref >60)
GLUCOSE: 111 mg/dL — AB (ref 65–99)
Potassium: 4.8 mmol/L (ref 3.5–5.1)
SODIUM: 138 mmol/L (ref 135–145)

## 2014-11-11 MED ORDER — LIDOCAINE VISCOUS 2 % MT SOLN
OROMUCOSAL | Status: AC
  Filled 2014-11-11: qty 15

## 2014-11-11 NOTE — Progress Notes (Signed)
Subjective:  Remains on the ventilator at this time. Underwent HD today. 3200 cc TPN 83 cc/hr  Objective:  Vital signs in last 24 hours:  98.3  97  22  138/96      Physical Exam: General: On the vent, NAD  HEENT [place]/AT OM moist, NG tube in place  Neck Trach in place  Pulm/lungs Bilateral rhonchi,  Vent assisted, fio2 30%  CVS/Heart S1S2 irregular  Abdomen:  Distended, BS present, colosotomy in place, biliary drain  Extremities: Trace b/l LE edema  Neurologic: Awake, alert, follows simple commands  Skin: No acute rashes  Access: Left radiocephalic AVF       Basic Metabolic Panel:  Recent Labs Lab 11/06/14 0555 11/09/14 0500 11/09/14 0519 11/11/14 0507  NA 138 135  --  138  138  K 4.1 5.1  --  4.9  4.8  CL 97* 95*  --  98*  98*  CO2 30 29  --  31  32  GLUCOSE 113* 120*  --  111*  111*  BUN 43* 61*  --  54*  54*  CREATININE 4.82* 6.37*  --  5.42*  5.48*  CALCIUM 9.5 9.2  --  9.5  9.5  MG  --   --  2.5*  --   PHOS 4.7*  --  5.4* 6.1*     CBC:  Recent Labs Lab 11/06/14 0555 11/09/14 0519 11/11/14 0507  WBC 4.9 7.6 6.4  HGB 7.5* 6.9* 6.8*  HCT 25.3* 21.7* 22.2*  MCV 87.2 85.4 89.9  PLT 187 177 247      Microbiology: Results for orders placed or performed during the hospital encounter of 10/14/14  Culture, blood (routine x 2)     Status: None   Collection Time: 10/16/14  9:16 AM  Result Value Ref Range Status   Specimen Description BLOOD PICC LINE  Final   Special Requests BOTTLES DRAWN AEROBIC AND ANAEROBIC  Final   Culture   Final    NO GROWTH 5 DAYS Performed at [facility]    Report Status 10/22/2014 FINAL  Final  Culture, blood (routine x 2)     Status: None   Collection Time: 10/16/14 10:25 AM  Result Value Ref Range Status   Specimen Description BLOOD RIGHT HAND  Final   Special Requests BOTTLES DRAWN AEROBIC ONLY 10CC  Final   Culture   Final    NO GROWTH 5 DAYS Performed at [facility]    Report  Status 10/22/2014 FINAL  Final  Culture, blood (routine x 2)     Status: None   Collection Time: 10/16/14 10:53 PM  Result Value Ref Range Status   Specimen Description BLOOD RIGHT ANTECUBITAL  Final   Special Requests BOTTLES DRAWN AEROBIC AND ANAEROBIC 10CC  Final   Culture   Final    NO GROWTH 5 DAYS Performed at [facility]    Report Status 10/23/2014 FINAL  Final  Culture, blood (routine x 2)     Status: None   Collection Time: 10/16/14 11:00 PM  Result Value Ref Range Status   Specimen Description BLOOD PICC LINE  Final   Special Requests BOTTLES DRAWN AEROBIC AND ANAEROBIC 10 CC  Final   Culture   Final    NO GROWTH 5 DAYS Performed at [facility]    Report Status 10/23/2014 FINAL  Final  Culture, expectorated sputum-assessment     Status: None   Collection Time: 10/17/14  6:15 AM  Result  Value Ref Range Status   Specimen Description SPUTUM  Final   Special Requests NONE  Final   Sputum evaluation   Final    THIS SPECIMEN IS ACCEPTABLE. RESPIRATORY CULTURE REPORT TO FOLLOW.   Report Status 10/17/2014 FINAL  Final  Culture, respiratory (NON-Expectorated)     Status: None   Collection Time: 10/17/14  8:00 AM  Result Value Ref Range Status   Specimen Description SPUTUM  Final   Special Requests NONE  Final   Gram Stain   Final    NO WBC SEEN NO SQUAMOUS EPITHELIAL CELLS SEEN RARE GRAM POSITIVE COCCI IN PAIRS [facility]formed at [facility]    Culture   Final    NORMAL OROPHARYNGEAL FLORA Performed at [facility]    Report Status 10/19/2014 FINAL  Final  Body fluid culture     Status: None   Collection Time: 10/18/14 10:07 AM  Result Value Ref Range Status   Specimen Description PLEURAL LEFT FLUID  Final   Special Requests NONE  Final   Gram Stain   Final    NO WBC SEEN NO ORGANISMS SEEN [facility]formed at [facility]    Culture   Final    NO GROWTH 3 DAYS Performed at [facility]    Report Status 10/22/2014 FINAL   Final  Culture, body fluid-bottle     Status: None   Collection Time: 10/23/14  9:39 AM  Result Value Ref Range Status   Specimen Description FLUID BILE  Final   Special Requests NONE  Final   Culture NO GROWTH 5 DAYS  Final   Report Status 10/28/2014 FINAL  Final  Gram stain     Status: None   Collection Time: 10/23/14  9:39 AM  Result Value Ref Range Status   Specimen Description FLUID BILE  Final   Special Requests NONE  Final   Gram Stain NO WBC SEEN NO ORGANISMS SEEN   Final   Report Status 10/23/2014 FINAL  Final  Clostridium Difficile by PCR (not at [facility])     Status: None   Collection Time: 10/29/14  4:59 AM  Result Value Ref Range Status   C difficile by pcr NEGATIVE NEGATIVE Final  Culture, respiratory (NON-Expectorated)     Status: None (Preliminary result)   Collection Time: 11/07/14  6:01 PM  Result Value Ref Range Status   Specimen Description TRACHEAL ASPIRATE  Final   Special Requests NONE  Final   Gram Stain   Final    RARE WBC PRESENT,BOTH PMN AND MONONUCLEAR NO SQUAMOUS EPITHELIAL CELLS SEEN ABUNDANT GRAM NEGATIVE RODS Performed at [facility]    Culture   Final    MODERATE PSEUDOMONAS AERUGINOSA MODERATE ESCHERICHIA COLI Performed at [facility]    Report Status PENDING  Incomplete   Organism ID, Bacteria ESCHERICHIA COLI  Final      Susceptibility   Escherichia coli - MIC*    AMPICILLIN >=32 RESISTANT Resistant     AMPICILLIN/SULBACTAM >=32 RESISTANT Resistant     CEFAZOLIN 32 RESISTANT Resistant     CEFEPIME <=1 SENSITIVE Sensitive     CEFTAZIDIME <=1 SENSITIVE Sensitive     CEFTRIAXONE <=1 SENSITIVE Sensitive     CIPROFLOXACIN >=4 RESISTANT Resistant     GENTAMICIN <=1 SENSITIVE Sensitive     IMIPENEM 0.5 SENSITIVE Sensitive     PIP/TAZO >=128 RESISTANT Resistant     TOBRAMYCIN <=1 SENSITIVE Sensitive     TRIMETH/SULFA >=320 RESISTANT Resistant     *  MODERATE ESCHERICHIA COLI    Coagulation Studies: No results for  input(s): LABPROT, INR in the last 72 hours.  Urinalysis: No results for input(s): COLORURINE, LABSPEC, PHURINE, GLUCOSEU, HGBUR, BILIRUBINUR, KETONESUR, PROTEINUR, UROBILINOGEN, NITRITE, LEUKOCYTESUR in the last 72 hours.  Invalid input(s): APPERANCEUR    Imaging: No results found.   Medications:       Assessment/ Plan:  54 y.o. male  with  ESRD, HTN, CHF, Previous alcohol abuse, tobacco abse, , was admitted to [facility] from [facility] on 10/14/2014 with resp failure. He is s/p Ex Lap, sigmoid colectomy and colostomy placement. He has a wound vac. Regular dialysis at [facility] center in [place], [place]  1. ESRD - HD completed. 3200 cc removed - daily dialysis requested as long patient is requiring TPN  2. AOCKD - Continue Aranesp.  - blood transfusion given  3. SHPTH - phosphorus 6.1    4. Abdominal distention  - s/p paracentesis.  5.  Acute respiratory failure:   Patient remains on the ventilator. FiO2 30%. Weaning per pulmonary critical care.   Daily dialysis for now   [facility] 7/6/20163:41 PM

## 2014-11-12 LAB — TYPE AND SCREEN
ABO/RH(D): O POS
Antibody Screen: NEGATIVE
Unit division: 0
Unit division: 0

## 2014-11-12 LAB — CBC
HCT: 24 % — ABNORMAL LOW (ref 39.0–52.0)
HEMOGLOBIN: 7.5 g/dL — AB (ref 13.0–17.0)
MCH: 27.8 pg (ref 26.0–34.0)
MCHC: 31.3 g/dL (ref 30.0–36.0)
MCV: 88.9 fL (ref 78.0–100.0)
Platelets: 241 10*3/uL (ref 150–400)
RBC: 2.7 MIL/uL — ABNORMAL LOW (ref 4.22–5.81)
RDW: 19.5 % — ABNORMAL HIGH (ref 11.5–15.5)
WBC: 6.6 10*3/uL (ref 4.0–10.5)

## 2014-11-12 LAB — BASIC METABOLIC PANEL
Anion gap: 8 (ref 5–15)
BUN: 29 mg/dL — ABNORMAL HIGH (ref 6–20)
CO2: 30 mmol/L (ref 22–32)
Calcium: 9.6 mg/dL (ref 8.9–10.3)
Chloride: 100 mmol/L — ABNORMAL LOW (ref 101–111)
Creatinine, Ser: 4.1 mg/dL — ABNORMAL HIGH (ref 0.61–1.24)
GFR calc Af Amer: 18 mL/min — ABNORMAL LOW (ref >60)
GFR calc non Af Amer: 15 mL/min — ABNORMAL LOW (ref >60)
Glucose, Bld: 79 mg/dL (ref 65–99)
Potassium: 4.1 mmol/L (ref 3.5–5.1)
Sodium: 138 mmol/L (ref 135–145)

## 2014-11-12 LAB — CULTURE, RESPIRATORY

## 2014-11-12 LAB — CULTURE, RESPIRATORY W GRAM STAIN

## 2014-11-12 LAB — PHOSPHORUS: PHOSPHORUS: 3.6 mg/dL (ref 2.5–4.6)

## 2014-11-12 NOTE — Progress Notes (Signed)
Name: Harold Mayo MRN: [id number] DOB: 08/29/1960    ADMISSION DATE:  10/14/2014 CONSULTATION DATE: 10/15/2014  [facility]  CHIEF COMPLAINT:  Resp failure  BRIEF PATIENT DESCRIPTION:  54 yo male [facility]10[facility] with abdominal pain.  Had laparotomy, colon resection and abscess drainage.  Developed respiratory failure with failure to wean from vent and required tracheostomy 6/01.  [facility] for wound care, rehab, and vent weaning.  SUBJECTIVE:  Weaning on ATC,looks good  VITAL SIGNS: 104/48 90 18 99% 98.0  PHYSICAL EXAMINATION: General: comfortable on t collar Neuro:   Rass 1 follow simple commands HEENT: Tracheostomy 8 XLT Cardiovascular: HSR RRR Lungs: decreased bs bases Abdomen:  Open wound with mid line vac, colostomy Musculoskeletal:  intact Skin: warm and dry    CMP Latest Ref Rng 11/12/2014 11/11/2014 11/11/2014  Glucose 65 - 99 mg/dL 79 295(A111(H) 213(Y111(H)  BUN 6 - 20 mg/dL 86(V29(H) 78(I54(H) 69(G54(H)  Creatinine 0.61 - 1.24 mg/dL 2.95(M4.10(H) 8.41(L5.42(H) 2.44(W5.48(H)  Sodium 135 - 145 mmol/L 138 138 138  Potassium 3.5 - 5.1 mmol/L 4.1 4.9 4.8  Chloride 101 - 111 mmol/L 100(L) 98(L) 98(L)  CO2 22 - 32 mmol/L 30 31 32  Calcium 8.9 - 10.3 mg/dL 9.6 9.5 9.5  Total Protein 6.5 - 8.1 g/dL - - -  Total Bilirubin 0.3 - 1.2 mg/dL - - -  Alkaline Phos 38 - 126 U/L - - -  AST 15 - 41 U/L - - -  ALT 17 - 63 U/L - - -    CBC Latest Ref Rng 11/12/2014 11/11/2014 11/09/2014  WBC 4.0 - 10.5 K/uL 6.6 6.4 7.6  Hemoglobin 13.0 - 17.0 g/dL 7.5(L) 6.8(LL) 6.9(LL)  Hematocrit 39.0 - 52.0 % 24.0(L) 22.2(L) 21.7(L)  Platelets 150 - 400 K/uL 241 247 177    ABG    Component Value Date/Time   PHART 7.393 11/08/2014 0915   PCO2ART 46.0* 11/08/2014 0915   PO2ART 77.4* 11/08/2014 0915   HCO3 27.5* 11/08/2014 0915   TCO2 28.9 11/08/2014 0915   ACIDBASEDEF 0.6 10/16/2014 0500   O2SAT 94.5 11/08/2014 0915    No results found.   ASSESSMENT   Currently  tolerating  t collar well goal 12 hrs Hx of tobacco abuse. bilat effusions - lt thora 6/12 - lymphocytic exudate Plan: -advance ATC as tolerated   Sigmoid colectomy, colostomy, Hartmann pouch with open wound and wound vac. Plan: - wound care, nutrition per primary team   Hx of ESRD, DM type II, HTN, GERD, A flutter. S/p partial thyroidectomy, parathyroidectomy 10/07/14. Plan: - per primary team, cards following a flutter   [place] -  resolved Harold CanalesSteve Mayo ACNP Harold PollackLe Mayo PCCM Pager [phone] till 3 pm If no answer page [phone] 11/12/2014, 8:31 AM   STAFF NOTE: I, Harold Percyaniel Feinstein, MD FACP have personally reviewed patient's available data, including medical history, events of note, physical examination and test results as part of my evaluation. I have discussed with resident/NP and other care providers such as pharmacist, RN and RRT. In addition, I personally evaluated patient and elicited key findings of: trah collar since 7 am, did 8 hrs, now for 12 hours TC, for daily HD, hope this will improve, PMV use good when off vent, plan is daily HD, pcxr for volume status, 6 LT to remain, cuff down with TC as secreiotns are low  Harold Rossettianiel J. Tyson AliasFeinstein, MD, FACP Pgr: [phone] [facility] 11/12/2014 3:08 PM

## 2014-11-13 LAB — BASIC METABOLIC PANEL
ANION GAP: 11 (ref 5–15)
BUN: 25 mg/dL — ABNORMAL HIGH (ref 6–20)
CO2: 28 mmol/L (ref 22–32)
Calcium: 9.8 mg/dL (ref 8.9–10.3)
Chloride: 96 mmol/L — ABNORMAL LOW (ref 101–111)
Creatinine, Ser: 4.19 mg/dL — ABNORMAL HIGH (ref 0.61–1.24)
GFR, EST AFRICAN AMERICAN: 17 mL/min — AB (ref >60)
GFR, EST NON AFRICAN AMERICAN: 15 mL/min — AB (ref >60)
GLUCOSE: 77 mg/dL (ref 65–99)
Potassium: 4 mmol/L (ref 3.5–5.1)
SODIUM: 135 mmol/L (ref 135–145)

## 2014-11-13 LAB — HEMOGLOBIN AND HEMATOCRIT, BLOOD
HEMATOCRIT: 26.8 % — AB (ref 39.0–52.0)
HEMOGLOBIN: 8.4 g/dL — AB (ref 13.0–17.0)

## 2014-11-13 LAB — PHOSPHORUS: Phosphorus: 3.8 mg/dL (ref 2.5–4.6)

## 2014-11-13 LAB — HEPATITIS B SURFACE ANTIBODY,QUALITATIVE: Hep B S Ab: REACTIVE

## 2014-11-13 LAB — HEPATITIS B SURFACE ANTIGEN: Hepatitis B Surface Ag: NEGATIVE

## 2014-11-13 NOTE — Progress Notes (Signed)
Subjective:  Remains on the ventilator at this time. Underwent HD today. 3.1 L of fluid removed. BP dropped towards end of treatment Tube feeds [place]   Objective:  Vital signs in last 24 hours:  98.3  93  27  102/71       Physical Exam: General: NAD  HEENT [place]/AT OM moist, NG tube in place  Neck Trach in place  Pulm/lungs Bilateral rhonchi,  Trach capped  CVS/Heart S1S2 irregular  Abdomen:  Distended, BS present, colosotomy in place, biliary drain  Extremities: Trace b/l LE edema  Neurologic: Sleepy at present  Skin: No acute rashes  Access: Left radiocephalic AVF       Basic Metabolic Panel:  Recent Labs Lab 11/09/14 0500 11/09/14 0519 11/11/14 0507 11/12/14 0545 11/13/14 0518  NA 135  --  138  138 138 135  K 5.1  --  4.9  4.8 4.1 4.0  CL 95*  --  98*  98* 100* 96*  CO2 29  --  31  32 30 28  GLUCOSE 120*  --  111*  111* 79 77  BUN 61*  --  54*  54* 29* 25*  CREATININE 6.37*  --  5.42*  5.48* 4.10* 4.19*  CALCIUM 9.2  --  9.5  9.5 9.6 9.8  MG  --  2.5*  --   --   --   PHOS  --  5.4* 6.1* 3.6 3.8     CBC:  Recent Labs Lab 11/09/14 0519 11/11/14 0507 11/12/14 0545 11/13/14 0855  WBC 7.6 6.4 6.6  --   HGB 6.9* 6.8* 7.5* 8.4*  HCT 21.7* 22.2* 24.0* 26.8*  MCV 85.4 89.9 88.9  --   PLT 177 247 241  --       Microbiology: Results for orders placed or performed during the hospital encounter of 10/14/14  Culture, blood (routine x 2)     Status: None   Collection Time: 10/16/14  9:16 AM  Result Value Ref Range Status   Specimen Description BLOOD PICC LINE  Final   Special Requests BOTTLES DRAWN AEROBIC AND ANAEROBIC  Final   Culture   Final    NO GROWTH 5 DAYS Performed at [facility]    Report Status 10/22/2014 FINAL  Final  Culture, blood (routine x 2)     Status: None   Collection Time: 10/16/14 10:25 AM  Result Value Ref Range Status   Specimen Description BLOOD RIGHT HAND  Final   Special Requests BOTTLES DRAWN AEROBIC  ONLY 10CC  Final   Culture   Final    NO GROWTH 5 DAYS Performed at [facility]    Report Status 10/22/2014 FINAL  Final  Culture, blood (routine x 2)     Status: None   Collection Time: 10/16/14 10:53 PM  Result Value Ref Range Status   Specimen Description BLOOD RIGHT ANTECUBITAL  Final   Special Requests BOTTLES DRAWN AEROBIC AND ANAEROBIC 10CC  Final   Culture   Final    NO GROWTH 5 DAYS Performed at [facility]    Report Status 10/23/2014 FINAL  Final  Culture, blood (routine x 2)     Status: None   Collection Time: 10/16/14 11:00 PM  Result Value Ref Range Status   Specimen Description BLOOD PICC LINE  Final   Special Requests BOTTLES DRAWN AEROBIC AND ANAEROBIC 10 CC  Final   Culture   Final    NO GROWTH 5 DAYS Performed at [facility]    Report Status 10/23/2014 FINAL  Final  Culture, expectorated sputum-assessment     Status: None   Collection Time: 10/17/14  6:15 AM  Result Value Ref Range Status   Specimen Description SPUTUM  Final   Special Requests NONE  Final   Sputum evaluation   Final    THIS SPECIMEN IS ACCEPTABLE. RESPIRATORY CULTURE REPORT TO FOLLOW.   Report Status 10/17/2014 FINAL  Final  Culture, respiratory (NON-Expectorated)     Status: None   Collection Time: 10/17/14  8:00 AM  Result Value Ref Range Status   Specimen Description SPUTUM  Final   Special Requests NONE  Final   Gram Stain   Final    NO WBC SEEN NO SQUAMOUS EPITHELIAL CELLS SEEN RARE GRAM POSITIVE COCCI IN PAIRS Performed at [facility]    Culture   Final    NORMAL OROPHARYNGEAL FLORA Performed at [facility]    Report Status 10/19/2014 FINAL  Final  Body fluid culture     Status: None   Collection Time: 10/18/14 10:07 AM  Result Value Ref Range Status   Specimen Description PLEURAL LEFT FLUID  Final   Special Requests NONE  Final   Gram Stain   Final    NO WBC SEEN NO ORGANISMS SEEN Performed at [facility]     Culture   Final    NO GROWTH 3 DAYS Performed at [facility]    Report Status 10/22/2014 FINAL  Final  Culture, body fluid-bottle     Status: None   Collection Time: 10/23/14  9:39 AM  Result Value Ref Range Status   Specimen Description FLUID BILE  Final   Special Requests NONE  Final   Culture NO GROWTH 5 DAYS  Final   Report Status 10/28/2014 FINAL  Final  Gram stain     Status: None   Collection Time: 10/23/14  9:39 AM  Result Value Ref Range Status   Specimen Description FLUID BILE  Final   Special Requests NONE  Final   Gram Stain NO WBC SEEN NO ORGANISMS SEEN   Final   Report Status 10/23/2014 FINAL  Final  Clostridium Difficile by PCR (not at [facility])     Status: None   Collection Time: 10/29/14  4:59 AM  Result Value Ref Range Status   C difficile by pcr NEGATIVE NEGATIVE Final  Culture, respiratory (NON-Expectorated)     Status: None   Collection Time: 11/07/14  6:01 PM  Result Value Ref Range Status   Specimen Description TRACHEAL ASPIRATE  Final   Special Requests NONE  Final   Gram Stain   Final    RARE WBC PRESENT,BOTH PMN AND MONONUCLEAR NO SQUAMOUS EPITHELIAL CELLS SEEN ABUNDANT GRAM NEGATIVE RODS Performed at [facility]    Culture   Final    MODERATE PSEUDOMONAS AERUGINOSA 7 Note: COLISTIN SENSITIVE TO 0.38 ug/ml ETEST results for this drug are "FOR INVESTIGATIONAL USE ONLY" and should NOT be used for clinical purposes. MULTIDRUG RESISTANT CRITICAL RESULT CALLED TO, READ BACK BY AND VERIFIED WITH: HEATHER HUNTER 7 16@1154  MODERATE ESCHERICHIA COLI Performed at [facility]    Report Status 11/12/2014 FINAL  Final   Organism ID, Bacteria ESCHERICHIA COLI  Final   Organism ID, Bacteria PSEUDOMONAS AERUGINOSA  Final   Organism ID, Bacteria ESCHERICHIA COLI  Final      Susceptibility   Escherichia coli - MIC*    AMPICILLIN >=32 RESISTANT Resistant  AMPICILLIN/SULBACTAM >=32 RESISTANT Resistant     CEFAZOLIN 32 RESISTANT  Resistant     CEFEPIME <=1 SENSITIVE Sensitive     CEFTAZIDIME <=1 SENSITIVE Sensitive     CEFTRIAXONE <=1 SENSITIVE Sensitive     CIPROFLOXACIN >=4 RESISTANT Resistant     GENTAMICIN <=1 SENSITIVE Sensitive     IMIPENEM 0.5 SENSITIVE Sensitive     PIP/TAZO >=128 RESISTANT Resistant     TOBRAMYCIN <=1 SENSITIVE Sensitive     TRIMETH/SULFA >=320 RESISTANT Resistant    Escherichia coli - MIC*    AMPICILLIN >=32 RESISTANT Resistant     AMPICILLIN/SULBACTAM >=32 RESISTANT Resistant     CEFAZOLIN 32 RESISTANT Resistant     CEFEPIME <=1 SENSITIVE Sensitive     CEFTAZIDIME <=1 SENSITIVE Sensitive     CEFTRIAXONE <=1 SENSITIVE Sensitive     CIPROFLOXACIN >=4 RESISTANT Resistant     GENTAMICIN <=1 SENSITIVE Sensitive     IMIPENEM 0.5 SENSITIVE Sensitive     PIP/TAZO >=128 RESISTANT Resistant     TOBRAMYCIN <=1 SENSITIVE Sensitive     TRIMETH/SULFA >=320 RESISTANT Resistant     * MODERATE ESCHERICHIA COLI    MODERATE ESCHERICHIA COLI   Pseudomonas aeruginosa - MIC*    CEFEPIME >=64 RESISTANT Resistant     CEFTAZIDIME 32 RESISTANT Resistant     CIPROFLOXACIN >=4 RESISTANT Resistant     GENTAMICIN >=16 RESISTANT Resistant     IMIPENEM >=16 RESISTANT Resistant     PIP/TAZO >=128 RESISTANT Resistant     TOBRAMYCIN >=16 RESISTANT Resistant     * MODERATE PSEUDOMONAS AERUGINOSA    Coagulation Studies: No results for input(s): LABPROT, INR in the last 72 hours.  Urinalysis: No results for input(s): COLORURINE, LABSPEC, PHURINE, GLUCOSEU, HGBUR, BILIRUBINUR, KETONESUR, PROTEINUR, UROBILINOGEN, NITRITE, LEUKOCYTESUR in the last 72 hours.  Invalid input(s): APPERANCEUR    Imaging: No results found.   Medications:       Assessment/ Plan:  54 y.o. male  with  ESRD, HTN, CHF, Previous alcohol abuse, tobacco abse, , was admitted to [facility] from [facility] on 10/14/2014 with resp failure. He is s/p Ex Lap, sigmoid colectomy and colostomy placement. He has a wound vac. Regular  dialysis at [facility] center in [place], [place]  1. ESRD - HD completed. 3.1 Liters removed - next HD on Monday  2. AOCKD - Continue Aranesp.  - blood transfusion given  3. SHPTH - phosphorus 3.8, improved after TPN was stopped       [facility] 7/8/20164:25 PM

## 2014-11-14 LAB — HEMOGLOBIN A1C
Hgb A1c MFr Bld: 5.7 % — ABNORMAL HIGH (ref 4.8–5.6)
MEAN PLASMA GLUCOSE: 117 mg/dL

## 2014-11-16 ENCOUNTER — Other Ambulatory Visit (HOSPITAL_COMMUNITY): Payer: Self-pay

## 2014-11-16 DIAGNOSIS — J9811 Atelectasis: Secondary | ICD-10-CM

## 2014-11-16 LAB — COMPREHENSIVE METABOLIC PANEL
ALT: 21 U/L (ref 17–63)
AST: 34 U/L (ref 15–41)
Albumin: 2.8 g/dL — ABNORMAL LOW (ref 3.5–5.0)
Alkaline Phosphatase: 184 U/L — ABNORMAL HIGH (ref 38–126)
Anion gap: 12 (ref 5–15)
BUN: 35 mg/dL — ABNORMAL HIGH (ref 6–20)
CALCIUM: 9.9 mg/dL (ref 8.9–10.3)
CO2: 27 mmol/L (ref 22–32)
CREATININE: 7.31 mg/dL — AB (ref 0.61–1.24)
Chloride: 95 mmol/L — ABNORMAL LOW (ref 101–111)
GFR calc Af Amer: 9 mL/min — ABNORMAL LOW (ref >60)
GFR, EST NON AFRICAN AMERICAN: 8 mL/min — AB (ref >60)
Glucose, Bld: 86 mg/dL (ref 65–99)
POTASSIUM: 4.7 mmol/L (ref 3.5–5.1)
Sodium: 134 mmol/L — ABNORMAL LOW (ref 135–145)
Total Bilirubin: 2.1 mg/dL — ABNORMAL HIGH (ref 0.3–1.2)
Total Protein: 8.6 g/dL — ABNORMAL HIGH (ref 6.5–8.1)

## 2014-11-16 LAB — CBC
HCT: 28.7 % — ABNORMAL LOW (ref 39.0–52.0)
HEMOGLOBIN: 8.9 g/dL — AB (ref 13.0–17.0)
MCH: 27.2 pg (ref 26.0–34.0)
MCHC: 31 g/dL (ref 30.0–36.0)
MCV: 87.8 fL (ref 78.0–100.0)
Platelets: 327 10*3/uL (ref 150–400)
RBC: 3.27 MIL/uL — ABNORMAL LOW (ref 4.22–5.81)
RDW: 19 % — ABNORMAL HIGH (ref 11.5–15.5)
WBC: 5.6 10*3/uL (ref 4.0–10.5)

## 2014-11-16 LAB — MAGNESIUM: Magnesium: 2.3 mg/dL (ref 1.7–2.4)

## 2014-11-16 LAB — PHOSPHORUS: PHOSPHORUS: 6.3 mg/dL — AB (ref 2.5–4.6)

## 2014-11-16 LAB — TRIGLYCERIDES: TRIGLYCERIDES: 215 mg/dL — AB (ref <150)

## 2014-11-16 NOTE — Progress Notes (Signed)
Subjective:  Patient completed HD today. Tolerated well.  UF achieved was 1kg. Trach now capped.    Objective:  Vital signs in last 24 hours:  99.8 101 23 [id number]    Physical Exam: General: NAD  HEENT [place]/AT OM moist  Neck Trach in place, capped  Pulm/lungs Scattered rhonchi, normal effort  CVS/Heart S1S2 irregular  Abdomen:  Soft NTND BS present colostomy  Extremities: Trace b/l LE edema  Neurologic: Awake, alert, follows simple commands  Skin: No acute rashes  Access: Left radiocephalic AVF       Basic Metabolic Panel:  Recent Labs Lab 11/11/14 0507 11/12/14 0545 11/13/14 0518 11/16/14 0555  NA 138  138 138 135 134*  K 4.9  4.8 4.1 4.0 4.7  CL 98*  98* 100* 96* 95*  CO2 31  32 30 28 27   GLUCOSE 111*  111* 79 77 86  BUN 54*  54* 29* 25* 35*  CREATININE 5.42*  5.48* 4.10* 4.19* 7.31*  CALCIUM 9.5  9.5 9.6 9.8 9.9  MG  --   --   --  2.3  PHOS 6.1* 3.6 3.8 6.3*     CBC:  Recent Labs Lab 11/11/14 0507 11/12/14 0545 11/13/14 0855 11/16/14 0555  WBC 6.4 6.6  --  5.6  HGB 6.8* 7.5* 8.4* 8.9*  HCT 22.2* 24.0* 26.8* 28.7*  MCV 89.9 88.9  --  87.8  PLT 247 241  --  327      Microbiology: Results for orders placed or performed during the hospital encounter of 10/14/14  Culture, blood (routine x 2)     Status: None   Collection Time: 10/16/14  9:16 AM  Result Value Ref Range Status   Specimen Description BLOOD PICC LINE  Final   Special Requests BOTTLES DRAWN AEROBIC AND ANAEROBIC 10ML  Final   Culture   Final    NO GROWTH 5 DAYS Performed at [facility]    Report Status 10/22/2014 FINAL  Final  Culture, blood (routine x 2)     Status: None   Collection Time: 10/16/14 10:25 AM  Result Value Ref Range Status   Specimen Description BLOOD RIGHT HAND  Final   Special Requests BOTTLES DRAWN AEROBIC ONLY 10CC  Final   Culture   Final    NO GROWTH 5 DAYS Performed at [facility]    Report Status 10/22/2014 FINAL  Final   Culture, blood (routine x 2)     Status: None   Collection Time: 10/16/14 10:53 PM  Result Value Ref Range Status   Specimen Description BLOOD RIGHT ANTECUBITAL  Final   Special Requests BOTTLES DRAWN AEROBIC AND ANAEROBIC 10CC  Final   Culture   Final    NO GROWTH 5 DAYS Performed at [facility]    Report Status 10/23/2014 FINAL  Final  Culture, blood (routine x 2)     Status: None   Collection Time: 10/16/14 11:00 PM  Result Value Ref Range Status   Specimen Description BLOOD PICC LINE  Final   Special Requests BOTTLES DRAWN AEROBIC AND ANAEROBIC 10 CC  Final   Culture   Final    NO GROWTH 5 DAYS Performed at [facility]    Report Status 10/23/2014 FINAL  Final  Culture, expectorated sputum-assessment     Status: None   Collection Time: 10/17/14  6:15 AM  Result Value Ref Range Status   Specimen Description SPUTUM  Final   Special Requests NONE  Final   Sputum  evaluation   Final    THIS SPECIMEN IS ACCEPTABLE. RESPIRATORY CULTURE REPORT TO FOLLOW.   Report Status 10/17/2014 FINAL  Final  Culture, respiratory (NON-Expectorated)     Status: None   Collection Time: 10/17/14  8:00 AM  Result Value Ref Range Status   Specimen Description SPUTUM  Final   Special Requests NONE  Final   Gram Stain   Final    NO WBC SEEN NO SQUAMOUS EPITHELIAL CELLS SEEN RARE GRAM POSITIVE COCCI IN PAIRS Performed at [facility]    Culture   Final    NORMAL OROPHARYNGEAL FLORA Performed at [facility]    Report Status 10/19/2014 FINAL  Final  Body fluid culture     Status: None   Collection Time: 10/18/14 10:07 AM  Result Value Ref Range Status   Specimen Description PLEURAL LEFT FLUID  Final   Special Requests NONE  Final   Gram Stain   Final    NO WBC SEEN NO ORGANISMS SEEN Performed at [facility]    Culture   Final    NO GROWTH 3 DAYS Performed at [facility]    Report Status 10/22/2014 FINAL  Final  Culture, body  fluid-bottle     Status: None   Collection Time: 10/23/14  9:39 AM  Result Value Ref Range Status   Specimen Description FLUID BILE  Final   Special Requests NONE  Final   Culture NO GROWTH 5 DAYS  Final   Report Status 10/28/2014 FINAL  Final  Gram stain     Status: None   Collection Time: 10/23/14  9:39 AM  Result Value Ref Range Status   Specimen Description FLUID BILE  Final   Special Requests NONE  Final   Gram Stain NO WBC SEEN NO ORGANISMS SEEN   Final   Report Status 10/23/2014 FINAL  Final  Clostridium Difficile by PCR (not at [facility])     Status: None   Collection Time: 10/29/14  4:59 AM  Result Value Ref Range Status   C difficile by pcr NEGATIVE NEGATIVE Final  Culture, respiratory (NON-Expectorated)     Status: None   Collection Time: 11/07/14  6:01 PM  Result Value Ref Range Status   Specimen Description TRACHEAL ASPIRATE  Final   Special Requests NONE  Final   Gram Stain   Final    RARE WBC PRESENT,BOTH PMN AND MONONUCLEAR NO SQUAMOUS EPITHELIAL CELLS SEEN ABUNDANT GRAM NEGATIVE RODS Performed at [facility]    Culture   Final    MODERATE PSEUDOMONAS AERUGINOSA 7 Note: COLISTIN SENSITIVE TO 0.38 ug/ml ETEST results for this drug are "FOR INVESTIGATIONAL USE ONLY" and should NOT be used for clinical purposes. MULTIDRUG RESISTANT CRITICAL RESULT CALLED TO, READ BACK BY AND VERIFIED WITH: HEATHER HUNTER 7 16@1154  MODERATE ESCHERICHIA COLI Performed at [facility]    Report Status 11/12/2014 FINAL  Final   Organism ID, Bacteria ESCHERICHIA COLI  Final   Organism ID, Bacteria PSEUDOMONAS AERUGINOSA  Final   Organism ID, Bacteria ESCHERICHIA COLI  Final      Susceptibility   Escherichia coli - MIC*    AMPICILLIN >=32 RESISTANT Resistant     AMPICILLIN/SULBACTAM >=32 RESISTANT Resistant     CEFAZOLIN 32 RESISTANT Resistant     CEFEPIME <=1 SENSITIVE Sensitive     CEFTAZIDIME <=1 SENSITIVE Sensitive     CEFTRIAXONE <=1 SENSITIVE Sensitive      CIPROFLOXACIN >=4 RESISTANT Resistant     GENTAMICIN <=1  SENSITIVE Sensitive     IMIPENEM 0.5 SENSITIVE Sensitive     PIP/TAZO >=128 RESISTANT Resistant     TOBRAMYCIN <=1 SENSITIVE Sensitive     TRIMETH/SULFA >=320 RESISTANT Resistant    Escherichia coli - MIC*    AMPICILLIN >=32 RESISTANT Resistant     AMPICILLIN/SULBACTAM >=32 RESISTANT Resistant     CEFAZOLIN 32 RESISTANT Resistant     CEFEPIME <=1 SENSITIVE Sensitive     CEFTAZIDIME <=1 SENSITIVE Sensitive     CEFTRIAXONE <=1 SENSITIVE Sensitive     CIPROFLOXACIN >=4 RESISTANT Resistant     GENTAMICIN <=1 SENSITIVE Sensitive     IMIPENEM 0.5 SENSITIVE Sensitive     PIP/TAZO >=128 RESISTANT Resistant     TOBRAMYCIN <=1 SENSITIVE Sensitive     TRIMETH/SULFA >=320 RESISTANT Resistant     * MODERATE ESCHERICHIA COLI    MODERATE ESCHERICHIA COLI   Pseudomonas aeruginosa - MIC*    CEFEPIME >=64 RESISTANT Resistant     CEFTAZIDIME 32 RESISTANT Resistant     CIPROFLOXACIN >=4 RESISTANT Resistant     GENTAMICIN >=16 RESISTANT Resistant     IMIPENEM >=16 RESISTANT Resistant     PIP/TAZO >=128 RESISTANT Resistant     TOBRAMYCIN >=16 RESISTANT Resistant     * MODERATE PSEUDOMONAS AERUGINOSA    Coagulation Studies: No results for input(s): LABPROT, INR in the last 72 hours.  Urinalysis: No results for input(s): COLORURINE, LABSPEC, PHURINE, GLUCOSEU, HGBUR, BILIRUBINUR, KETONESUR, PROTEINUR, UROBILINOGEN, NITRITE, LEUKOCYTESUR in the last 72 hours.  Invalid input(s): APPERANCEUR    Imaging: Dg Chest Port 1 View  11/16/2014   CLINICAL DATA:  Respiratory failure, tracheostomy patient, significant abdominal surgery in the past 2 months, history of diabetes and end-stage renal disease on dialysis.  EXAM: PORTABLE CHEST - 1 VIEW  COMPARISON:  Chest x-ray of November 07, 2014  FINDINGS: There is improved appearance of the pulmonary interstitium bilaterally consistent with decreased edema. The cardiac silhouette remains enlarged. The  retrocardiac region remains dense and the left hemidiaphragm remains obscured. The tracheostomy appliance tip lies 4.6 cm above the carina. The right-sided PICC line tip projects over the junction of the proximal and midportions of the SVC. The bony structures exhibit no acute abnormality.  IMPRESSION: Decreased pulmonary interstitial edema. Persistent left lower lobe atelectasis and small left pleural effusion.   Electronically Signed   By: David  [place] M.D.   On: 11/16/2014 07:34     Medications:       Assessment/ Plan:  54 y.o. male  with  ESRD, HTN, CHF, Previous alcohol abuse, tobacco abse, , was admitted to [facility] from [facility] on 10/14/2014 with resp failure. He is s/p Ex Lap, sigmoid colectomy and colostomy placement. He has a wound vac. Regular dialysis at [facility] center in [place], [place]  1. ESRD - Pt completed HD today, tolerated well, UF achieved 1kg, next HD on Wednesday.  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  3. SHPTH - phosphorus and higher over the weekend, repeat phos on Wednesday.  4. Abdominal distention  - s/p paracentesis.  5.  Acute respiratory failure:   Now off vent, trach in place and capped, pt able to verbalize at the moment.    Chevi Lim 7/11/20166:21 PM

## 2014-11-16 NOTE — Progress Notes (Signed)
Name: Harold Mayo MRN: [id number] DOB: 04/14/1961    ADMISSION DATE:  10/14/2014 CONSULTATION DATE: 10/15/2014  [facility]  CHIEF COMPLAINT:  Resp failure  BRIEF PATIENT DESCRIPTION:  54 yo male [facility] 09/10/14 with abdominal pain.  Had laparotomy, colon resection and abscess drainage.  Developed respiratory failure with failure to wean from vent and required tracheostomy 6/01.  [facility] for wound care, rehab, and vent weaning.  SUBJECTIVE:  Weaning on ATC,looks good  VITAL SIGNS: 104/48 90 18 99% 98.0  PHYSICAL EXAMINATION: General: comfortable on t collar Neuro:   Rass 1 follow simple commands HEENT: Tracheostomy 8 XLT Cardiovascular: HSR RRR Lungs: decreased bs bases Abdomen:  Open wound with mid line vac, colostomy Musculoskeletal:  intact Skin: warm and dry    CMP Latest Ref Rng 11/16/2014 11/13/2014 11/12/2014  Glucose 65 - 99 mg/dL 86 77 79  BUN 6 - 20 mg/dL 40(J35(H) 81(X25(H) 91(Y29(H)  Creatinine 0.61 - 1.24 mg/dL 7.82(N7.31(H) 5.62(Z4.19(H) 3.08(M4.10(H)  Sodium 135 - 145 mmol/L 134(L) 135 138  Potassium 3.5 - 5.1 mmol/L 4.7 4.0 4.1  Chloride 101 - 111 mmol/L 95(L) 96(L) 100(L)  CO2 22 - 32 mmol/L 27 28 30   Calcium 8.9 - 10.3 mg/dL 9.9 9.8 9.6  Total Protein 6.5 - 8.1 g/dL 5.7(Q8.6(H) - -  Total Bilirubin 0.3 - 1.2 mg/dL 2.1(H) - -  Alkaline Phos 38 - 126 U/L 184(H) - -  AST 15 - 41 U/L 34 - -  ALT 17 - 63 U/L 21 - -    CBC Latest Ref Rng 11/16/2014 11/13/2014 11/12/2014  WBC 4.0 - 10.5 K/uL 5.6 - 6.6  Hemoglobin 13.0 - 17.0 g/dL 4.6(N8.9(L) 6.2(X8.4(L) 7.5(L)  Hematocrit 39.0 - 52.0 % 28.7(L) 26.8(L) 24.0(L)  Platelets 150 - 400 K/uL 327 - 241    ABG    Component Value Date/Time   PHART 7.393 11/08/2014 0915   PCO2ART 46.0* 11/08/2014 0915   PO2ART 77.4* 11/08/2014 0915   HCO3 27.5* 11/08/2014 0915   TCO2 28.9 11/08/2014 0915   ACIDBASEDEF 0.6 10/16/2014 0500   O2SAT 94.5 11/08/2014 0915    Dg Chest Port 1 View  11/16/2014   CLINICAL  DATA:  Respiratory failure, tracheostomy patient, significant abdominal surgery in the past 2 months, history of diabetes and end-stage renal disease on dialysis.  EXAM: PORTABLE CHEST - 1 VIEW  COMPARISON:  Chest x-ray of November 07, 2014  FINDINGS: There is improved appearance of the pulmonary interstitium bilaterally consistent with decreased edema. The cardiac silhouette remains enlarged. The retrocardiac region remains dense and the left hemidiaphragm remains obscured. The tracheostomy appliance tip lies 4.6 cm above the carina. The right-sided PICC line tip projects over the junction of the proximal and midportions of the SVC. The bony structures exhibit no acute abnormality.  IMPRESSION: Decreased pulmonary interstitial edema. Persistent left lower lobe atelectasis and small left pleural effusion.   Electronically Signed   By: David  [place]aziland[place] M.D.   On: 11/16/2014 07:34     ASSESSMENT   Currently tolerating  t collar well goal 12 hrs Hx of tobacco abuse. bilat effusions - lt thora 6/12 - lymphocytic exudate Plan: -advance ATC as tolerated   Sigmoid colectomy, colostomy, Hartmann pouch with open wound and wound vac. Plan: - wound care, nutrition per primary team   Hx of ESRD, DM type II, HTN, GERD, A flutter. S/p partial thyroidectomy, parathyroidectomy 10/07/14. Plan: - per primary team, cards following a flutter  Delerium -  resolved  Attending note:  54 year old male with respiratory failure following abdominal surgery and prolonged hospitalization. I reviewed the CXR myself, decreased pulmonary edema with LLL atelectasis and small pleural effusion. Discussed with SSH-MD, PCCM-MD and RT bedside. Continue weaning efforts.  Acute respiratory failure due to deconditioning and prolonged hospitalization. - TC for 24 hrs at this point. - Titrate O2 for sats. - Pulmonary hygienes. - Titrate O2 down as able for target sat of  88-92%. - PMV as tolerated and cuff down.  Atelectasis on CXR:  - Chest PT.  - Pulmonary hygienes.  Tracheostomy status - If continues to tolerate TC overnight, will cap trach in AM.  - If tolerating CAP x48 hours will consider decannulation on Thursday.  Sigmoid colectomy, colostomy, Hartmann pouch with open wound and wound vac. - Wound care, nutrition per primary team - Vigilance for new infections , while onTNA  Delirium:  - IV haldol as ordered. - Taper Klonopin to off as able. - Continue seroquel for now.  Deconditioning: - Continue PT efforts but patient is not cooperative and that is creating a great deal of difficulty.  Protein calorie malnutrition - Continue TPN - Will need to discuss when able to use GI tract.  Patient seen and examined, agree with above note. I dictated the care and orders written for this patient under my direction.  Alyson Reedy, MD [phone]  11/16/2014 2:24 PM

## 2014-11-18 LAB — RENAL FUNCTION PANEL
ANION GAP: 14 (ref 5–15)
Albumin: 2.9 g/dL — ABNORMAL LOW (ref 3.5–5.0)
BUN: 38 mg/dL — ABNORMAL HIGH (ref 6–20)
CHLORIDE: 97 mmol/L — AB (ref 101–111)
CO2: 27 mmol/L (ref 22–32)
CREATININE: 6.62 mg/dL — AB (ref 0.61–1.24)
Calcium: 10.1 mg/dL (ref 8.9–10.3)
GFR calc Af Amer: 10 mL/min — ABNORMAL LOW (ref >60)
GFR, EST NON AFRICAN AMERICAN: 9 mL/min — AB (ref >60)
Glucose, Bld: 87 mg/dL (ref 65–99)
Phosphorus: 6.4 mg/dL — ABNORMAL HIGH (ref 2.5–4.6)
Potassium: 4.7 mmol/L (ref 3.5–5.1)
Sodium: 138 mmol/L (ref 135–145)

## 2014-11-18 LAB — CBC
HCT: 29.6 % — ABNORMAL LOW (ref 39.0–52.0)
Hemoglobin: 9 g/dL — ABNORMAL LOW (ref 13.0–17.0)
MCH: 27.1 pg (ref 26.0–34.0)
MCHC: 30.4 g/dL (ref 30.0–36.0)
MCV: 89.2 fL (ref 78.0–100.0)
Platelets: 319 10*3/uL (ref 150–400)
RBC: 3.32 MIL/uL — ABNORMAL LOW (ref 4.22–5.81)
RDW: 18.9 % — AB (ref 11.5–15.5)
WBC: 5.4 10*3/uL (ref 4.0–10.5)

## 2014-11-18 LAB — BASIC METABOLIC PANEL
ANION GAP: 12 (ref 5–15)
BUN: 21 mg/dL — ABNORMAL HIGH (ref 6–20)
CHLORIDE: 97 mmol/L — AB (ref 101–111)
CO2: 26 mmol/L (ref 22–32)
CREATININE: 3.85 mg/dL — AB (ref 0.61–1.24)
Calcium: 9.9 mg/dL (ref 8.9–10.3)
GFR calc non Af Amer: 17 mL/min — ABNORMAL LOW (ref >60)
GFR, EST AFRICAN AMERICAN: 19 mL/min — AB (ref >60)
Glucose, Bld: 115 mg/dL — ABNORMAL HIGH (ref 65–99)
Potassium: 4.6 mmol/L (ref 3.5–5.1)
Sodium: 135 mmol/L (ref 135–145)

## 2014-11-18 NOTE — Progress Notes (Signed)
Subjective:  Hemodialysis earlier today. Tolerated treatment well. UF of 2 litres. Patient currently without complaints   Objective:  Vital signs in last 24 hours:  T 97.6 p 104 rr 28 bp 136/70 SpO2 98%    Physical Exam: General: NAD  HEENT [place]/AT OM moist  Neck Trach in place, capped with PMV  Pulm/lungs clear  CVS/Heart S1S2 irregular - tachy  Abdomen:  Soft NTND BS present colostomy  Extremities: No dependent edema  Neurologic: Awake, alert, follows simple commands  Skin: No acute rashes  Access: Left radiocephalic AVF       Basic Metabolic Panel:  Recent Labs Lab 11/12/14 0545 11/13/14 0518 11/16/14 0555 11/18/14 0439  NA 138 135 134* 138  K 4.1 4.0 4.7 4.7  CL 100* 96* 95* 97*  CO2 GLUCOSE 79 77 86 87  BUN 29* 25* 35* 38*  CREATININE 4.10* 4.19* 7.31* 6.62*  CALCIUM 9.6 9.8 9.9 10.1  MG  --   --  2.3  --   PHOS 3.6 3.8 6.3* 6.4*     CBC:  Recent Labs Lab 11/12/14 0545 11/13/14 0855 11/16/14 0555 11/18/14 0439  WBC 6.6  --  5.6 5.4  HGB 7.5* 8.4* 8.9* 9.0*  HCT 24.0* 26.8* 28.7* 29.6*  MCV 88.9  --  87.8 89.2  PLT 241  --  327 319      Microbiology: Results for orders placed or performed during the hospital encounter of 10/14/14  Culture, blood (routine x 2)     Status: None   Collection Time: 10/16/14  9:16 AM  Result Value Ref Range Status   Specimen Description BLOOD PICC LINE  Final   Special Requests BOTTLES DRAWN AEROBIC AND ANAEROBIC  Final   Culture   Final    NO GROWTH 5 DAYS Performed at [facility]    Report Status 10/22/2014 FINAL  Final  Culture, blood (routine x 2)     Status: None   Collection Time: 10/16/14 10:25 AM  Result Value Ref Range Status   Specimen Description BLOOD RIGHT HAND  Final   Special Requests BOTTLES DRAWN AEROBIC ONLY 10CC  Final   Culture   Final    NO GROWTH 5 DAYS Performed at [facility]    Report Status 10/22/2014 FINAL  Final  Culture, blood (routine x  2)     Status: None   Collection Time: 10/16/14 10:53 PM  Result Value Ref Range Status   Specimen Description BLOOD RIGHT ANTECUBITAL  Final   Special Requests BOTTLES DRAWN AEROBIC AND ANAEROBIC 10CC  Final   Culture   Final    NO GROWTH 5 DAYS Performed at [facility]    Report Status 10/23/2014 FINAL  Final  Culture, blood (routine x 2)     Status: None   Collection Time: 10/16/14 11:00 PM  Result Value Ref Range Status   Specimen Description BLOOD PICC LINE  Final   Special Requests BOTTLES DRAWN AEROBIC AND ANAEROBIC 10 CC  Final   Culture   Final    NO GROWTH 5 DAYS Performed at [facility]    Report Status 10/23/2014 FINAL  Final  Culture, expectorated sputum-assessment     Status: None   Collection Time: 10/17/14  6:15 AM  Result Value Ref Range Status   Specimen Description SPUTUM  Final   Special Requests NONE  Final   Sputum evaluation   Final    THIS SPECIMEN IS ACCEPTABLE.  RESPIRATORY CULTURE REPORT TO FOLLOW.   Report Status 10/17/2014 FINAL  Final  Culture, respiratory (NON-Expectorated)     Status: None   Collection Time: 10/17/14  8:00 AM  Result Value Ref Range Status   Specimen Description SPUTUM  Final   Special Requests NONE  Final   Gram Stain   Final    NO WBC SEEN NO SQUAMOUS EPITHELIAL CELLS SEEN RARE GRAM POSITIVE COCCI IN PAIRS Performed at [facility]    Culture   Final    NORMAL OROPHARYNGEAL FLORA Performed at [facility]    Report Status 10/19/2014 FINAL  Final  Body fluid culture     Status: None   Collection Time: 10/18/14 10:07 AM  Result Value Ref Range Status   Specimen Description PLEURAL LEFT FLUID  Final   Special Requests NONE  Final   Gram Stain   Final    NO WBC SEEN NO ORGANISMS SEEN Performed at [facility]    Culture   Final    NO GROWTH 3 DAYS Performed at [facility]    Report Status 10/22/2014 FINAL  Final  Culture, body fluid-bottle     Status: None    Collection Time: 10/23/14  9:39 AM  Result Value Ref Range Status   Specimen Description FLUID BILE  Final   Special Requests NONE  Final   Culture NO GROWTH 5 DAYS  Final   Report Status 10/28/2014 FINAL  Final  Gram stain     Status: None   Collection Time: 10/23/14  9:39 AM  Result Value Ref Range Status   Specimen Description FLUID BILE  Final   Special Requests NONE  Final   Gram Stain NO WBC SEEN NO ORGANISMS SEEN   Final   Report Status 10/23/2014 FINAL  Final  Clostridium Difficile by PCR (not at [facility])     Status: None   Collection Time: 10/29/14  4:59 AM  Result Value Ref Range Status   C difficile by pcr NEGATIVE NEGATIVE Final  Culture, respiratory (NON-Expectorated)     Status: None   Collection Time: 11/07/14  6:01 PM  Result Value Ref Range Status   Specimen Description TRACHEAL ASPIRATE  Final   Special Requests NONE  Final   Gram Stain   Final    RARE WBC PRESENT,BOTH PMN AND MONONUCLEAR NO SQUAMOUS EPITHELIAL CELLS SEEN ABUNDANT GRAM NEGATIVE RODS Performed at [facility]    Culture   Final    MODERATE PSEUDOMONAS AERUGINOSA 7 Note: COLISTIN SENSITIVE TO 0.38 ug/ml ETEST results for this drug are "FOR INVESTIGATIONAL USE ONLY" and should NOT be used for clinical purposes. MULTIDRUG RESISTANT CRITICAL RESULT CALLED TO, READ BACK BY AND VERIFIED WITH: HEATHER HUNTER 7 16@1154  MODERATE ESCHERICHIA COLI Performed at [facility]    Report Status 11/12/2014 FINAL  Final   Organism ID, Bacteria ESCHERICHIA COLI  Final   Organism ID, Bacteria PSEUDOMONAS AERUGINOSA  Final   Organism ID, Bacteria ESCHERICHIA COLI  Final      Susceptibility   Escherichia coli - MIC*    AMPICILLIN >=32 RESISTANT Resistant     AMPICILLIN/SULBACTAM >=32 RESISTANT Resistant     CEFAZOLIN 32 RESISTANT Resistant     CEFEPIME <=1 SENSITIVE Sensitive     CEFTAZIDIME <=1 SENSITIVE Sensitive     CEFTRIAXONE <=1 SENSITIVE Sensitive     CIPROFLOXACIN >=4 RESISTANT  Resistant     GENTAMICIN <=1 SENSITIVE Sensitive     IMIPENEM 0.5 SENSITIVE Sensitive  PIP/TAZO >=128 RESISTANT Resistant     TOBRAMYCIN <=1 SENSITIVE Sensitive     TRIMETH/SULFA >=320 RESISTANT Resistant    Escherichia coli - MIC*    AMPICILLIN >=32 RESISTANT Resistant     AMPICILLIN/SULBACTAM >=32 RESISTANT Resistant     CEFAZOLIN 32 RESISTANT Resistant     CEFEPIME <=1 SENSITIVE Sensitive     CEFTAZIDIME <=1 SENSITIVE Sensitive     CEFTRIAXONE <=1 SENSITIVE Sensitive     CIPROFLOXACIN >=4 RESISTANT Resistant     GENTAMICIN <=1 SENSITIVE Sensitive     IMIPENEM 0.5 SENSITIVE Sensitive     PIP/TAZO >=128 RESISTANT Resistant     TOBRAMYCIN <=1 SENSITIVE Sensitive     TRIMETH/SULFA >=320 RESISTANT Resistant     * MODERATE ESCHERICHIA COLI    MODERATE ESCHERICHIA COLI   Pseudomonas aeruginosa - MIC*    CEFEPIME >=64 RESISTANT Resistant     CEFTAZIDIME 32 RESISTANT Resistant     CIPROFLOXACIN >=4 RESISTANT Resistant     GENTAMICIN >=16 RESISTANT Resistant     IMIPENEM >=16 RESISTANT Resistant     PIP/TAZO >=128 RESISTANT Resistant     TOBRAMYCIN >=16 RESISTANT Resistant     * MODERATE PSEUDOMONAS AERUGINOSA    Coagulation Studies: No results for input(s): LABPROT, INR in the last 72 hours.  Urinalysis: No results for input(s): COLORURINE, LABSPEC, PHURINE, GLUCOSEU, HGBUR, BILIRUBINUR, KETONESUR, PROTEINUR, UROBILINOGEN, NITRITE, LEUKOCYTESUR in the last 72 hours.  Invalid input(s): APPERANCEUR    Imaging: No results found.   Medications:       Assessment/ Plan:  54 y.o. male  with  ESRD, HTN, CHF, Previous alcohol abuse, tobacco abse, , [facility]2014 with resp failure. He is s/p Ex Lap, sigmoid colectomy and colostomy placement. He has a wound vac. [facility] [place]  1. ESRD: Continue MWF schedule. Tolerated treatment well today. Plan on next treatment for Friday. Orders prepared.    2. AOCKD: hemoglobin of 9. Currently onf Aranesp 200mcg [place] q weekly. Improving  3. SHPTH: PTH low at 69 on 10/16/14.  phosphorus elevated at 6.4. Not currently on a binder. Previously on Renvela 2.4g tid with meals.  - Will start patient on Renvela powder 2.4gram with meals.  - phosphorus and higher over the weekend, repeat phos on Wednesday. - Check PTH again this month  4. Hypertension: well controlled.  On clonidine and metoprolol.   ColomaKOLLURU, Threasa HeadsSARATH 7/13/20164:09 PM

## 2014-11-19 DIAGNOSIS — J398 Other specified diseases of upper respiratory tract: Secondary | ICD-10-CM

## 2014-11-19 NOTE — Progress Notes (Signed)
Name: Harold Mayo MRN: [id number] DOB: 28-Apr-1961    ADMISSION DATE:  10/14/2014 CONSULTATION DATE: 10/15/2014  REFERRING MD :  [facility]  CHIEF COMPLAINT:  Resp failure  BRIEF PATIENT DESCRIPTION:  54 yo male admitted to [facility] 09/10/14 with abdominal pain.  Had laparotomy, colon resection and abscess drainage.  Developed respiratory failure with failure to wean from vent and required tracheostomy 6/01.  Transferred to [facility] 6/08 for wound care, rehab, and vent weaning.  SUBJECTIVE:  Weaning on ATC, increased secretion overnight.  VITAL SIGNS: 104/48 90 18 99% 98.0  PHYSICAL EXAMINATION: General: comfortable on t collar Neuro:   Rass 1 follow simple commands HEENT: Tracheostomy 8 XLT Cardiovascular: HSR RRR Lungs: decreased bs bases Abdomen:  Open wound with mid line vac, colostomy Musculoskeletal:  intact Skin: warm and dry    CMP Latest Ref Rng 11/18/2014 11/18/2014 11/16/2014  Glucose 65 - 99 mg/dL 409(W) 87 86  BUN 6 - 20 mg/dL 11(B) 14(N) 82(N)  Creatinine 0.61 - 1.24 mg/dL 5.62(Z) 3.08(M) 5.78(I)  Sodium 135 - 145 mmol/L 135 138 134(L)  Potassium 3.5 - 5.1 mmol/L 4.6 4.7 4.7  Chloride 101 - 111 mmol/L 97(L) 97(L) 95(L)  CO2 22 - 32 mmol/L Calcium 8.9 - 10.3 mg/dL 9.9 69.6 9.9  Total Protein 6.5 - 8.1 g/dL - - 8.6(H)  Total Bilirubin 0.3 - 1.2 mg/dL - - 2.1(H)  Alkaline Phos 38 - 126 U/L - - 184(H)  AST 15 - 41 U/L - - 34  ALT 17 - 63 U/L - - 21    CBC Latest Ref Rng 11/18/2014 11/16/2014 11/13/2014  WBC 4.0 - 10.5 K/uL 5.4 5.6 -  Hemoglobin 13.0 - 17.0 g/dL 9.0(L) 8.9(L) 8.4(L)  Hematocrit 39.0 - 52.0 % 29.6(L) 28.7(L) 26.8(L)  Platelets 150 - 400 K/uL 319 327 -    ABG    Component Value Date/Time   PHART 7.393 11/08/2014 0915   PCO2ART 46.0* 11/08/2014 0915   PO2ART 77.4* 11/08/2014 0915   HCO3 27.5* 11/08/2014 0915   TCO2 28.9 11/08/2014 0915   ACIDBASEDEF 0.6 10/16/2014 0500   O2SAT 94.5 11/08/2014 0915    No results  found.   ASSESSMENT   Currently tolerating  t collar well goal 12 hrs Hx of tobacco abuse. bilat effusions - lt thora 6/12 - lymphocytic exudate Plan: -advance ATC as tolerated   Sigmoid colectomy, colostomy, Hartmann pouch with open wound and wound vac. Plan: - wound care, nutrition per primary team   Hx of ESRD, DM type II, HTN, GERD, A flutter. S/p partial thyroidectomy, parathyroidectomy 10/07/14. Plan: - per primary team, cards following a flutter   Delerium -  resolved  Attending note:  54 year old male with respiratory failure following abdominal surgery and prolonged hospitalization. I reviewed the CXR myself, decreased pulmonary edema with LLL atelectasis and small pleural effusion. Discussed with SSH-MD, PCCM-MD and RT bedside. Continue weaning efforts.  Acute respiratory failure due to deconditioning and prolonged hospitalization. - TC for 24 hrs at this point. - Titrate O2 for sats. - Pulmonary hygienes. - Titrate O2 down as able for target sat of 88-92%. - PMV as tolerated and cuff down.  Atelectasis on CXR:  - Chest PT.  - Pulmonary hygienes.  Tracheostomy status, increased secretion prohibiting decannulation. - May continue with PMV trials but no decannulation given increased secretion.  - Mucomyst and keep dry to control secretions.  Sigmoid colectomy, colostomy, Hartmann pouch with open wound and wound vac. -  Wound care, nutrition per primary team - Vigilance for new infections , while onTNA  Delirium:  - IV haldol as ordered. - Taper Klonopin to off as able. - Continue seroquel for now.  Deconditioning: - Continue PT efforts but patient is not cooperative and that is creating a great deal of difficulty.  Protein calorie malnutrition - Continue TPN - Will need to discuss  when able to use GI tract.  Patient seen and examined, agree with above note. I dictated the care and orders written for this patient under my direction.  Alyson ReedyWesam G Yacoub, MD [phone]  11/19/2014 12:47 PM

## 2014-11-20 LAB — CBC
HCT: 29.5 % — ABNORMAL LOW (ref 39.0–52.0)
Hemoglobin: 8.9 g/dL — ABNORMAL LOW (ref 13.0–17.0)
MCH: 26.6 pg (ref 26.0–34.0)
MCHC: 30.2 g/dL (ref 30.0–36.0)
MCV: 88.1 fL (ref 78.0–100.0)
Platelets: 271 10*3/uL (ref 150–400)
RBC: 3.35 MIL/uL — ABNORMAL LOW (ref 4.22–5.81)
RDW: 18.7 % — AB (ref 11.5–15.5)
WBC: 5.7 10*3/uL (ref 4.0–10.5)

## 2014-11-20 NOTE — Progress Notes (Signed)
Subjective:  Pt seen during HD. UF achieved 2.8kg thus far.  Awake and alert, no acute complaints.   Objective:  Vital signs in last 24 hours:  98.6 84 18 99/7    Physical Exam: General: NAD  HEENT [place]/AT OM moist NG in palce  Neck Trach in place, capped with PMV  Pulm/lungs Clear bilateral, normal effort  CVS/Heart S1S2 irregular   Abdomen:  Soft NTND BS present colostomy  Extremities: No dependent edema  Neurologic: Awake, alert, follows simple commands  Skin: No acute rashes  Access: Left radiocephalic AVF       Basic Metabolic Panel:  Recent Labs Lab 11/16/14 0555 11/18/14 0439 11/18/14 1621  NA 134* 138 135  K 4.7 4.7 4.6  CL 95* 97* 97*  CO2 GLUCOSE 86 87 115*  BUN 35* 38* 21*  CREATININE 7.31* 6.62* 3.85*  CALCIUM 9.9 10.1 9.9  MG 2.3  --   --   PHOS 6.3* 6.4*  --      CBC:  Recent Labs Lab 11/13/14 0855 11/16/14 0555 11/18/14 0439 11/20/14 0430  WBC  --  5.6 5.4 5.7  HGB 8.4* 8.9* 9.0* 8.9*  HCT 26.8* 28.7* 29.6* 29.5*  MCV  --  87.8 89.2 88.1  PLT  --  327 319 271      Microbiology: Results for orders placed or performed during the hospital encounter of 10/14/14  Culture, blood (routine x 2)     Status: None   Collection Time: 10/16/14  9:16 AM  Result Value Ref Range Status   Specimen Description BLOOD PICC LINE  Final   Special Requests BOTTLES DRAWN AEROBIC AND ANAEROBIC  Final   Culture   Final    NO GROWTH 5 DAYS Performed at [facility]    Report Status 10/22/2014 FINAL  Final  Culture, blood (routine x 2)     Status: None   Collection Time: 10/16/14 10:25 AM  Result Value Ref Range Status   Specimen Description BLOOD RIGHT HAND  Final   Special Requests BOTTLES DRAWN AEROBIC ONLY 10CC  Final   Culture   Final    NO GROWTH 5 DAYS Performed at [facility]    Report Status 10/22/2014 FINAL  Final  Culture, blood (routine x 2)     Status: None   Collection Time: 10/16/14 10:53 PM   Result Value Ref Range Status   Specimen Description BLOOD RIGHT ANTECUBITAL  Final   Special Requests BOTTLES DRAWN AEROBIC AND ANAEROBIC 10CC  Final   Culture   Final    NO GROWTH 5 DAYS Performed at [facility]    Report Status 10/23/2014 FINAL  Final  Culture, blood (routine x 2)     Status: None   Collection Time: 10/16/14 11:00 PM  Result Value Ref Range Status   Specimen Description BLOOD PICC LINE  Final   Special Requests BOTTLES DRAWN AEROBIC AND ANAEROBIC 10 CC  Final   Culture   Final    NO GROWTH 5 DAYS Performed at [facility]    Report Status 10/23/2014 FINAL  Final  Culture, expectorated sputum-assessment     Status: None   Collection Time: 10/17/14  6:15 AM  Result Value Ref Range Status   Specimen Description SPUTUM  Final   Special Requests NONE  Final   Sputum evaluation   Final    THIS SPECIMEN IS ACCEPTABLE. RESPIRATORY CULTURE REPORT TO FOLLOW.   Report Status 10/17/2014 FINAL  Final  Culture, respiratory (NON-Expectorated)     Status: None   Collection Time: 10/17/14  8:00 AM  Result Value Ref Range Status   Specimen Description SPUTUM  Final   Special Requests NONE  Final   Gram Stain   Final    NO WBC SEEN NO SQUAMOUS EPITHELIAL CELLS SEEN RARE GRAM POSITIVE COCCI IN PAIRS Performed at [facility]    Culture   Final    NORMAL OROPHARYNGEAL FLORA Performed at [facility]    Report Status 10/19/2014 FINAL  Final  Body fluid culture     Status: None   Collection Time: 10/18/14 10:07 AM  Result Value Ref Range Status   Specimen Description PLEURAL LEFT FLUID  Final   Special Requests NONE  Final   Gram Stain   Final    NO WBC SEEN NO ORGANISMS SEEN Performed at [facility]    Culture   Final    NO GROWTH 3 DAYS Performed at [facility]    Report Status 10/22/2014 FINAL  Final  Culture, body fluid-bottle     Status: None   Collection Time: 10/23/14  9:39 AM  Result Value Ref Range  Status   Specimen Description FLUID BILE  Final   Special Requests NONE  Final   Culture NO GROWTH 5 DAYS  Final   Report Status 10/28/2014 FINAL  Final  Gram stain     Status: None   Collection Time: 10/23/14  9:39 AM  Result Value Ref Range Status   Specimen Description FLUID BILE  Final   Special Requests NONE  Final   Gram Stain NO WBC SEEN NO ORGANISMS SEEN   Final   Report Status 10/23/2014 FINAL  Final  Clostridium Difficile by PCR (not at [facility])     Status: None   Collection Time: 10/29/14  4:59 AM  Result Value Ref Range Status   C difficile by pcr NEGATIVE NEGATIVE Final  Culture, respiratory (NON-Expectorated)     Status: None   Collection Time: 11/07/14  6:01 PM  Result Value Ref Range Status   Specimen Description TRACHEAL ASPIRATE  Final   Special Requests NONE  Final   Gram Stain   Final    RARE WBC PRESENT,BOTH PMN AND MONONUCLEAR NO SQUAMOUS EPITHELIAL CELLS SEEN ABUNDANT GRAM NEGATIVE RODS Performed at [facility]    Culture   Final    MODERATE PSEUDOMONAS AERUGINOSA 7 Note: COLISTIN SENSITIVE TO 0.38 ug/ml ETEST results for this drug are "FOR INVESTIGATIONAL USE ONLY" and should NOT be used for clinical purposes. MULTIDRUG RESISTANT CRITICAL RESULT CALLED TO, READ BACK BY AND VERIFIED WITH: HEATHER HUNTER 7 16@1154  MODERATE ESCHERICHIA COLI Performed at [facility]    Report Status 11/12/2014 FINAL  Final   Organism ID, Bacteria ESCHERICHIA COLI  Final   Organism ID, Bacteria PSEUDOMONAS AERUGINOSA  Final   Organism ID, Bacteria ESCHERICHIA COLI  Final      Susceptibility   Escherichia coli - MIC*    AMPICILLIN >=32 RESISTANT Resistant     AMPICILLIN/SULBACTAM >=32 RESISTANT Resistant     CEFAZOLIN 32 RESISTANT Resistant     CEFEPIME <=1 SENSITIVE Sensitive     CEFTAZIDIME <=1 SENSITIVE Sensitive     CEFTRIAXONE <=1 SENSITIVE Sensitive     CIPROFLOXACIN >=4 RESISTANT Resistant     GENTAMICIN <=1 SENSITIVE Sensitive     IMIPENEM 0.5  SENSITIVE Sensitive     PIP/TAZO >=128 RESISTANT Resistant     TOBRAMYCIN <=  1 SENSITIVE Sensitive     TRIMETH/SULFA >=320 RESISTANT Resistant    Escherichia coli - MIC*    AMPICILLIN >=32 RESISTANT Resistant     AMPICILLIN/SULBACTAM >=32 RESISTANT Resistant     CEFAZOLIN 32 RESISTANT Resistant     CEFEPIME <=1 SENSITIVE Sensitive     CEFTAZIDIME <=1 SENSITIVE Sensitive     CEFTRIAXONE <=1 SENSITIVE Sensitive     CIPROFLOXACIN >=4 RESISTANT Resistant     GENTAMICIN <=1 SENSITIVE Sensitive     IMIPENEM 0.5 SENSITIVE Sensitive     PIP/TAZO >=128 RESISTANT Resistant     TOBRAMYCIN <=1 SENSITIVE Sensitive     TRIMETH/SULFA >=320 RESISTANT Resistant     * MODERATE ESCHERICHIA COLI    MODERATE ESCHERICHIA COLI   Pseudomonas aeruginosa - MIC*    CEFEPIME >=64 RESISTANT Resistant     CEFTAZIDIME 32 RESISTANT Resistant     CIPROFLOXACIN >=4 RESISTANT Resistant     GENTAMICIN >=16 RESISTANT Resistant     IMIPENEM >=16 RESISTANT Resistant     PIP/TAZO >=128 RESISTANT Resistant     TOBRAMYCIN >=16 RESISTANT Resistant     * MODERATE PSEUDOMONAS AERUGINOSA    Coagulation Studies: No results for input(s): LABPROT, INR in the last 72 hours.  Urinalysis: No results for input(s): COLORURINE, LABSPEC, PHURINE, GLUCOSEU, HGBUR, BILIRUBINUR, KETONESUR, PROTEINUR, UROBILINOGEN, NITRITE, LEUKOCYTESUR in the last 72 hours.  Invalid input(s): APPERANCEUR    Imaging: No results found.   Medications:       Assessment/ Plan:  54 y.o. male  with  ESRD, HTN, CHF, Previous alcohol abuse, tobacco abse, , [facility]2014 with resp failure. He is s/p Ex Lap, sigmoid colectomy and colostomy placement. He has a wound vac. [facility] [place], [place]  1. ESRD: Pt seen during HD, UF target gross is 3kg, net 2.5kg.  Will plan for next HD on Monday.  2. 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  3. SHPTH: pt  back on renvela, awaiting repeat phos as last phos was high.  4. Hypertension: BP acceptable, continue clonidine and metoprolol.  Harold Mayo 7/15/20168:40 AM

## 2014-11-21 LAB — PTH, INTACT AND CALCIUM
Calcium, Total (PTH): 10.1 mg/dL (ref 8.7–10.2)
PTH: 61 pg/mL (ref 15–65)

## 2014-11-23 LAB — CBC WITH DIFFERENTIAL/PLATELET
BASOS ABS: 0 10*3/uL (ref 0.0–0.1)
BASOS PCT: 1 % (ref 0–1)
Eosinophils Absolute: 0.9 10*3/uL — ABNORMAL HIGH (ref 0.0–0.7)
Eosinophils Relative: 15 % — ABNORMAL HIGH (ref 0–5)
HCT: 30.5 % — ABNORMAL LOW (ref 39.0–52.0)
HEMOGLOBIN: 9.5 g/dL — AB (ref 13.0–17.0)
LYMPHS ABS: 1.9 10*3/uL (ref 0.7–4.0)
LYMPHS PCT: 30 % (ref 12–46)
MCH: 26.8 pg (ref 26.0–34.0)
MCHC: 31.1 g/dL (ref 30.0–36.0)
MCV: 86.2 fL (ref 78.0–100.0)
Monocytes Absolute: 0.7 10*3/uL (ref 0.1–1.0)
Monocytes Relative: 11 % (ref 3–12)
Neutro Abs: 2.7 10*3/uL (ref 1.7–7.7)
Neutrophils Relative %: 43 % (ref 43–77)
Platelets: 344 10*3/uL (ref 150–400)
RBC: 3.54 MIL/uL — ABNORMAL LOW (ref 4.22–5.81)
RDW: 18.4 % — AB (ref 11.5–15.5)
WBC: 6.3 10*3/uL (ref 4.0–10.5)

## 2014-11-23 LAB — COMPREHENSIVE METABOLIC PANEL
ALBUMIN: 2.9 g/dL — AB (ref 3.5–5.0)
ALT: 19 U/L (ref 17–63)
AST: 25 U/L (ref 15–41)
Alkaline Phosphatase: 211 U/L — ABNORMAL HIGH (ref 38–126)
Anion gap: 15 (ref 5–15)
BUN: 69 mg/dL — ABNORMAL HIGH (ref 6–20)
CALCIUM: 10.4 mg/dL — AB (ref 8.9–10.3)
CO2: 23 mmol/L (ref 22–32)
Chloride: 95 mmol/L — ABNORMAL LOW (ref 101–111)
Creatinine, Ser: 8.7 mg/dL — ABNORMAL HIGH (ref 0.61–1.24)
GFR, EST AFRICAN AMERICAN: 7 mL/min — AB (ref >60)
GFR, EST NON AFRICAN AMERICAN: 6 mL/min — AB (ref >60)
Glucose, Bld: 82 mg/dL (ref 65–99)
Potassium: 5.3 mmol/L — ABNORMAL HIGH (ref 3.5–5.1)
Sodium: 133 mmol/L — ABNORMAL LOW (ref 135–145)
Total Bilirubin: 1.8 mg/dL — ABNORMAL HIGH (ref 0.3–1.2)
Total Protein: 9.2 g/dL — ABNORMAL HIGH (ref 6.5–8.1)

## 2014-11-23 LAB — PHOSPHORUS: Phosphorus: 7.3 mg/dL — ABNORMAL HIGH (ref 2.5–4.6)

## 2014-11-23 LAB — TRIGLYCERIDES: Triglycerides: 114 mg/dL (ref <150)

## 2014-11-23 LAB — MAGNESIUM: Magnesium: 2.4 mg/dL (ref 1.7–2.4)

## 2014-11-23 NOTE — Progress Notes (Signed)
Subjective:  Pt was dialyzed earlier today.  UF 1500 cc   Objective:  Vital signs in last 24 hours:  98.4  92  25  114/70      Physical Exam: General: NAD  HEENT [place]/AT OM moist NG in palce  Neck Trach in place, capped with PMV  Pulm/lungs Clear bilateral, normal effort  CVS/Heart S1S2 irregular   Abdomen:  Soft NTND BS present colostomy  Extremities: No dependent edema  Neurologic: Awake, alert, follows simple commands  Skin: No acute rashes  Access: Left radiocephalic AVF       Basic Metabolic Panel:  Recent Labs Lab 11/18/14 0439 11/18/14 1621 11/20/14 0430 11/23/14 0515  NA 138 135  --  133*  K 4.7 4.6  --  5.3*  CL 97* 97*  --  95*  CO2 27 26  --  23  GLUCOSE 87 115*  --  82  BUN 38* 21*  --  69*  CREATININE 6.62* 3.85*  --  8.70*  CALCIUM 10.1 9.9 10.1 10.4*  MG  --   --   --  2.4  PHOS 6.4*  --   --  7.3*     CBC:  Recent Labs Lab 11/18/14 0439 11/20/14 0430 11/23/14 0515  WBC 5.4 5.7 6.3  NEUTROABS  --   --  2.7  HGB 9.0* 8.9* 9.5*  HCT 29.6* 29.5* 30.5*  MCV 89.2 88.1 86.2  PLT 319 271 344      Microbiology: Results for orders placed or performed during the hospital encounter of 10/14/14  Culture, blood (routine x 2)     Status: None   Collection Time: 10/16/14  9:16 AM  Result Value Ref Range Status   Specimen Description BLOOD PICC LINE  Final   Special Requests BOTTLES DRAWN AEROBIC AND ANAEROBIC  Final   Culture   Final    NO GROWTH 5 DAYS Performed at [facility]    Report Status 10/22/2014 FINAL  Final  Culture, blood (routine x 2)     Status: None   Collection Time: 10/16/14 10:25 AM  Result Value Ref Range Status   Specimen Description BLOOD RIGHT HAND  Final   Special Requests BOTTLES DRAWN AEROBIC ONLY 10CC  Final   Culture   Final    NO GROWTH 5 DAYS Performed at [facility]    Report Status 10/22/2014 FINAL  Final  Culture, blood (routine x 2)     Status: None   Collection Time: 10/16/14  10:53 PM  Result Value Ref Range Status   Specimen Description BLOOD RIGHT ANTECUBITAL  Final   Special Requests BOTTLES DRAWN AEROBIC AND ANAEROBIC 10CC  Final   Culture   Final    NO GROWTH 5 DAYS Performed at [facility]    Report Status 10/23/2014 FINAL  Final  Culture, blood (routine x 2)     Status: None   Collection Time: 10/16/14 11:00 PM  Result Value Ref Range Status   Specimen Description BLOOD PICC LINE  Final   Special Requests BOTTLES DRAWN AEROBIC AND ANAEROBIC 10 CC  Final   Culture   Final    NO GROWTH 5 DAYS Performed at [facility]    Report Status 10/23/2014 FINAL  Final  Culture, expectorated sputum-assessment     Status: None   Collection Time: 10/17/14  6:15 AM  Result Value Ref Range Status   Specimen Description SPUTUM  Final   Special Requests NONE  Final  Sputum evaluation   Final    THIS SPECIMEN IS ACCEPTABLE. RESPIRATORY CULTURE REPORT TO FOLLOW.   Report Status 10/17/2014 FINAL  Final  Culture, respiratory (NON-Expectorated)     Status: None   Collection Time: 10/17/14  8:00 AM  Result Value Ref Range Status   Specimen Description SPUTUM  Final   Special Requests NONE  Final   Gram Stain   Final    NO WBC SEEN NO SQUAMOUS EPITHELIAL CELLS SEEN RARE GRAM POSITIVE COCCI IN PAIRS Performed at [facility]    Culture   Final    NORMAL OROPHARYNGEAL FLORA Performed at [facility]    Report Status 10/19/2014 FINAL  Final  Body fluid culture     Status: None   Collection Time: 10/18/14 10:07 AM  Result Value Ref Range Status   Specimen Description PLEURAL LEFT FLUID  Final   Special Requests NONE  Final   Gram Stain   Final    NO WBC SEEN NO ORGANISMS SEEN Performed at [facility]    Culture   Final    NO GROWTH 3 DAYS Performed at [facility]    Report Status 10/22/2014 FINAL  Final  Culture, body fluid-bottle     Status: None   Collection Time: 10/23/14  9:39 AM  Result Value  Ref Range Status   Specimen Description FLUID BILE  Final   Special Requests NONE  Final   Culture NO GROWTH 5 DAYS  Final   Report Status 10/28/2014 FINAL  Final  Gram stain     Status: None   Collection Time: 10/23/14  9:39 AM  Result Value Ref Range Status   Specimen Description FLUID BILE  Final   Special Requests NONE  Final   Gram Stain NO WBC SEEN NO ORGANISMS SEEN   Final   Report Status 10/23/2014 FINAL  Final  Clostridium Difficile by PCR (not at [facility])     Status: None   Collection Time: 10/29/14  4:59 AM  Result Value Ref Range Status   C difficile by pcr NEGATIVE NEGATIVE Final  Culture, respiratory (NON-Expectorated)     Status: None   Collection Time: 11/07/14  6:01 PM  Result Value Ref Range Status   Specimen Description TRACHEAL ASPIRATE  Final   Special Requests NONE  Final   Gram Stain   Final    RARE WBC PRESENT,BOTH PMN AND MONONUCLEAR NO SQUAMOUS EPITHELIAL CELLS SEEN ABUNDANT GRAM NEGATIVE RODS Performed at [facility]    Culture   Final    MODERATE PSEUDOMONAS AERUGINOSA 7 Note: COLISTIN SENSITIVE TO 0.38 ug/ml ETEST results for this drug are "FOR INVESTIGATIONAL USE ONLY" and should NOT be used for clinical purposes. MULTIDRUG RESISTANT CRITICAL RESULT CALLED TO, READ BACK BY AND VERIFIED WITH: HEATHER HUNTER 7 16@1154  MODERATE ESCHERICHIA COLI Performed at [facility]    Report Status 11/12/2014 FINAL  Final   Organism ID, Bacteria ESCHERICHIA COLI  Final   Organism ID, Bacteria PSEUDOMONAS AERUGINOSA  Final   Organism ID, Bacteria ESCHERICHIA COLI  Final      Susceptibility   Escherichia coli - MIC*    AMPICILLIN >=32 RESISTANT Resistant     AMPICILLIN/SULBACTAM >=32 RESISTANT Resistant     CEFAZOLIN 32 RESISTANT Resistant     CEFEPIME <=1 SENSITIVE Sensitive     CEFTAZIDIME <=1 SENSITIVE Sensitive     CEFTRIAXONE <=1 SENSITIVE Sensitive     CIPROFLOXACIN >=4 RESISTANT Resistant     GENTAMICIN <=1  SENSITIVE Sensitive      IMIPENEM 0.5 SENSITIVE Sensitive     PIP/TAZO >=128 RESISTANT Resistant     TOBRAMYCIN <=1 SENSITIVE Sensitive     TRIMETH/SULFA >=320 RESISTANT Resistant    Escherichia coli - MIC*    AMPICILLIN >=32 RESISTANT Resistant     AMPICILLIN/SULBACTAM >=32 RESISTANT Resistant     CEFAZOLIN 32 RESISTANT Resistant     CEFEPIME <=1 SENSITIVE Sensitive     CEFTAZIDIME <=1 SENSITIVE Sensitive     CEFTRIAXONE <=1 SENSITIVE Sensitive     CIPROFLOXACIN >=4 RESISTANT Resistant     GENTAMICIN <=1 SENSITIVE Sensitive     IMIPENEM 0.5 SENSITIVE Sensitive     PIP/TAZO >=128 RESISTANT Resistant     TOBRAMYCIN <=1 SENSITIVE Sensitive     TRIMETH/SULFA >=320 RESISTANT Resistant     * MODERATE ESCHERICHIA COLI    MODERATE ESCHERICHIA COLI   Pseudomonas aeruginosa - MIC*    CEFEPIME >=64 RESISTANT Resistant     CEFTAZIDIME 32 RESISTANT Resistant     CIPROFLOXACIN >=4 RESISTANT Resistant     GENTAMICIN >=16 RESISTANT Resistant     IMIPENEM >=16 RESISTANT Resistant     PIP/TAZO >=128 RESISTANT Resistant     TOBRAMYCIN >=16 RESISTANT Resistant     * MODERATE PSEUDOMONAS AERUGINOSA    Coagulation Studies: No results for input(s): LABPROT, INR in the last 72 hours.  Urinalysis: No results for input(s): COLORURINE, LABSPEC, PHURINE, GLUCOSEU, HGBUR, BILIRUBINUR, KETONESUR, PROTEINUR, UROBILINOGEN, NITRITE, LEUKOCYTESUR in the last 72 hours.  Invalid input(s): APPERANCEUR    Imaging: No results found.   Medications:   aranesp 2000 q WED Renvela 2.4 gm AC    Assessment/ Plan:  54 y.o. male  with  ESRD, HTN, CHF, Previous alcohol abuse, tobacco abse, , was admitted to [facility] from [facility] on 10/14/2014 with resp failure. He is s/p Ex Lap, sigmoid colectomy and colostomy placement. He has a wound vac. Regular dialysis at [facility] center in [place], [place]  1. ESRD: underwent HD today BFR 400/DFR 800. UF 1500 cc Next HD on wed  2. AOCKD: hgb relatively stable at 9.5, continue high dose  aranesp.  3. SHPTH:  Phos 7.3 Continue renvela    Harold Mayo 7/18/20164:12 PM

## 2014-11-24 LAB — CBC WITH DIFFERENTIAL/PLATELET
Basophils Absolute: 0.1 10*3/uL (ref 0.0–0.1)
Basophils Relative: 1 % (ref 0–1)
EOS PCT: 12 % — AB (ref 0–5)
Eosinophils Absolute: 0.8 10*3/uL — ABNORMAL HIGH (ref 0.0–0.7)
HCT: 30.9 % — ABNORMAL LOW (ref 39.0–52.0)
Hemoglobin: 9.6 g/dL — ABNORMAL LOW (ref 13.0–17.0)
LYMPHS PCT: 37 % (ref 12–46)
Lymphs Abs: 2.4 10*3/uL (ref 0.7–4.0)
MCH: 27.2 pg (ref 26.0–34.0)
MCHC: 31.1 g/dL (ref 30.0–36.0)
MCV: 87.5 fL (ref 78.0–100.0)
MONO ABS: 1 10*3/uL (ref 0.1–1.0)
Monocytes Relative: 16 % — ABNORMAL HIGH (ref 3–12)
NEUTROS ABS: 2.2 10*3/uL (ref 1.7–7.7)
NEUTROS PCT: 34 % — AB (ref 43–77)
PLATELETS: 281 10*3/uL (ref 150–400)
RBC: 3.53 MIL/uL — ABNORMAL LOW (ref 4.22–5.81)
RDW: 18.8 % — ABNORMAL HIGH (ref 11.5–15.5)
WBC: 6.5 10*3/uL (ref 4.0–10.5)

## 2014-11-24 LAB — RENAL FUNCTION PANEL
ANION GAP: 11 (ref 5–15)
Albumin: 3.1 g/dL — ABNORMAL LOW (ref 3.5–5.0)
BUN: 42 mg/dL — ABNORMAL HIGH (ref 6–20)
CALCIUM: 10.4 mg/dL — AB (ref 8.9–10.3)
CHLORIDE: 95 mmol/L — AB (ref 101–111)
CO2: 28 mmol/L (ref 22–32)
Creatinine, Ser: 5.91 mg/dL — ABNORMAL HIGH (ref 0.61–1.24)
GFR calc Af Amer: 11 mL/min — ABNORMAL LOW (ref >60)
GFR, EST NON AFRICAN AMERICAN: 10 mL/min — AB (ref >60)
GLUCOSE: 85 mg/dL (ref 65–99)
PHOSPHORUS: 4.6 mg/dL (ref 2.5–4.6)
Potassium: 5.3 mmol/L — ABNORMAL HIGH (ref 3.5–5.1)
Sodium: 134 mmol/L — ABNORMAL LOW (ref 135–145)

## 2014-11-24 NOTE — Progress Notes (Signed)
Name: Harold Mayo MRN: [id number] DOB: 04/18/1961    ADMISSION DATE:  10/14/2014 CONSULTATION DATE: 10/15/2014  [facility]  CHIEF COMPLAINT:  Resp failure  BRIEF PATIENT DESCRIPTION:  54 yo male [facility] 09[facility]10/14 with abdominal pain.  Had laparotomy, colon resection and abscess drainage.  Developed respiratory failure with failure to wean from vent and required tracheostomy 6/01.  [facility], rehab, and vent weaning.  SUBJECTIVE:  No longer requiring MV Trach capped but thick secretions reported  VITAL SIGNS: 108/48 94 18-24 97% 98.7  PHYSICAL EXAMINATION: General: comfortable on t collar Neuro:   Rass 1 follow simple commands HEENT: Tracheostomy  6 proxXLT cuffed Cardiovascular: HSR RRR Lungs: decreased bs bases Abdomen:  Open wound with mid line vac, colostomy Musculoskeletal:  intact Skin: warm and dry , no edema   CMP Latest Ref Rng 11/24/2014 11/23/2014 11/20/2014  Glucose 65 - 99 mg/dL 85 82 -  BUN 6 - 20 mg/dL 65(H42(H) 84(O69(H) -  Creatinine 0.61 - 1.24 mg/dL 9.62(X5.91(H) 5.28(U8.70(H) -  Sodium 135 - 145 mmol/L 134(L) 133(L) -  Potassium 3.5 - 5.1 mmol/L 5.3(H) 5.3(H) -  Chloride 101 - 111 mmol/L 95(L) 95(L) -  CO2 22 - 32 mmol/L 28 23 -  Calcium 8.9 - 10.3 mg/dL 10.4(H) 10.4(H) 10.1  Total Protein 6.5 - 8.1 g/dL - 9.2(H) -  Total Bilirubin 0.3 - 1.2 mg/dL - 1.8(H) -  Alkaline Phos 38 - 126 U/L - 211(H) -  AST 15 - 41 U/L - 25 -  ALT 17 - 63 U/L - 19 -    CBC Latest Ref Rng 11/24/2014 11/23/2014 11/20/2014  WBC 4.0 - 10.5 K/uL 6.5 6.3 5.7  Hemoglobin 13.0 - 17.0 g/dL 1.3(K9.6(L) 4.4(W9.5(L) 8.9(L)  Hematocrit 39.0 - 52.0 % 30.9(L) 30.5(L) 29.5(L)  Platelets 150 - 400 K/uL 281 344 271    ABG    Component Value Date/Time   PHART 7.393 11/08/2014 0915   PCO2ART 46.0* 11/08/2014 0915   PO2ART 77.4* 11/08/2014 0915   HCO3 27.5* 11/08/2014 0915   TCO2 28.9 11/08/2014 0915   ACIDBASEDEF 0.6 10/16/2014 0500   O2SAT 94.5  11/08/2014 0915    No results found.   ASSESSMENT  Vent free - capped. Close to decannulation. Decision will likely depend on adequate secretion clearance, whether he needs a cholecystectomy in near future. I believe that I would progress him, even though he will need the surgery in the future.  Hx of tobacco abuse. bilat effusions - lt thora 6/12 - lymphocytic exudate Plan: I recommend decannulation 7/19 Push aggressive pulm hygiene   Sigmoid colectomy, colostomy, Hartmann pouch with open wound and wound vac. Cholecystitis s/p drain placement Plan: - wound care, nutrition per primary team - will need a cholecystectomy at some point in the future   Hx of ESRD, DM type II, HTN, GERD, A flutter. S/p partial thyroidectomy, parathyroidectomy 10/07/14.   Brett CanalesSteve Minor ACNP [facility] Pager [phone] till 3 pm If no answer page [phone] 11/24/2014, 9:14 AM  Attending Note:  I have examined patient, reviewed labs, studies and notes. I have discussed the case with S Minor, and I agree with the data and plans as amended above. I have also discussed case with Dr Sharyon MedicusHijazi and RT. Pt is managing his secretions much better, is tolerating a PO diet. He is working w PT and is stronger. I do not believe I would let a future plan for surgery  dissuade [place] from decannulation. Will proceed today and follow.  Independent critical care time is 35 minutes.   Levy Pupa, MD, PhD 11/24/2014, 11:03 AM [facility] [phone] or if no answer [phone]

## 2014-11-25 LAB — CBC
HCT: 29.5 % — ABNORMAL LOW (ref 39.0–52.0)
Hemoglobin: 9.2 g/dL — ABNORMAL LOW (ref 13.0–17.0)
MCH: 27 pg (ref 26.0–34.0)
MCHC: 31.2 g/dL (ref 30.0–36.0)
MCV: 86.5 fL (ref 78.0–100.0)
Platelets: 318 10*3/uL (ref 150–400)
RBC: 3.41 MIL/uL — ABNORMAL LOW (ref 4.22–5.81)
RDW: 18.3 % — ABNORMAL HIGH (ref 11.5–15.5)
WBC: 5.7 10*3/uL (ref 4.0–10.5)

## 2014-11-25 LAB — RENAL FUNCTION PANEL
ALBUMIN: 2.9 g/dL — AB (ref 3.5–5.0)
Anion gap: 15 (ref 5–15)
BUN: 62 mg/dL — AB (ref 6–20)
CO2: 25 mmol/L (ref 22–32)
CREATININE: 7.49 mg/dL — AB (ref 0.61–1.24)
Calcium: 10.2 mg/dL (ref 8.9–10.3)
Chloride: 91 mmol/L — ABNORMAL LOW (ref 101–111)
GFR calc Af Amer: 9 mL/min — ABNORMAL LOW (ref >60)
GFR calc non Af Amer: 7 mL/min — ABNORMAL LOW (ref >60)
Glucose, Bld: 88 mg/dL (ref 65–99)
Phosphorus: 5.7 mg/dL — ABNORMAL HIGH (ref 2.5–4.6)
Potassium: 5 mmol/L (ref 3.5–5.1)
SODIUM: 131 mmol/L — AB (ref 135–145)

## 2014-11-25 NOTE — Progress Notes (Signed)
Subjective:  Pt was dialyzed earlier today.  UF 1000 cc Laying in bed, watching TV   Objective:  Vital signs in last 24 hours:  97.9  102  28  115/66      Physical Exam: General: NAD  HEENT anicteric  Neck Trach in place, capped with PMV  Pulm/lungs Clear bilateral, normal effort  CVS/Heart S1S2 irregular   Abdomen:  Soft NT, colostomy  Extremities: No dependent edema  Neurologic: Awake, alert, follows simple commands  Skin: No acute rashes  Access: Left radiocephalic AVF       Basic Metabolic Panel:  Recent Labs Lab 11/18/14 1621  11/23/14 0515 11/24/14 0517 11/25/14 0658  NA 135  --  133* 134* 131*  K 4.6  --  5.3* 5.3* 5.0  CL 97*  --  95* 95* 91*  CO2 26  --  23 28 25   GLUCOSE 115*  --  82 85 88  BUN 21*  --  69* 42* 62*  CREATININE 3.85*  --  8.70* 5.91* 7.49*  CALCIUM 9.9  < > 10.4* 10.4* 10.2  MG  --   --  2.4  --   --   PHOS  --   --  7.3* 4.6 5.7*  < > = values in this interval not displayed.   CBC:  Recent Labs Lab 11/20/14 0430 11/23/14 0515 11/24/14 0527 11/25/14 0658  WBC 5.7 6.3 6.5 5.7  NEUTROABS  --  2.7 2.2  --   HGB 8.9* 9.5* 9.6* 9.2*  HCT 29.5* 30.5* 30.9* 29.5*  MCV 88.1 86.2 87.5 86.5  PLT 271 344 281 318      Microbiology: Results for orders placed or performed during the hospital encounter of 10/14/14  Culture, blood (routine x 2)     Status: None   Collection Time: 10/16/14  9:16 AM  Result Value Ref Range Status   Specimen Description BLOOD PICC LINE  Final   Special Requests BOTTLES DRAWN AEROBIC AND ANAEROBIC  Final   Culture   Final    NO GROWTH 5 DAYS Performed at [facility]    Report Status 10/22/2014 FINAL  Final  Culture, blood (routine x 2)     Status: None   Collection Time: 10/16/14 10:25 AM  Result Value Ref Range Status   Specimen Description BLOOD RIGHT HAND  Final   Special Requests BOTTLES DRAWN AEROBIC ONLY 10CC  Final   Culture   Final    NO GROWTH 5 DAYS Performed at  [facility]    Report Status 10/22/2014 FINAL  Final  Culture, blood (routine x 2)     Status: None   Collection Time: 10/16/14 10:53 PM  Result Value Ref Range Status   Specimen Description BLOOD RIGHT ANTECUBITAL  Final   Special Requests BOTTLES DRAWN AEROBIC AND ANAEROBIC 10CC  Final   Culture   Final    NO GROWTH 5 DAYS Performed at [facility]    Report Status 10/23/2014 FINAL  Final  Culture, blood (routine x 2)     Status: None   Collection Time: 10/16/14 11:00 PM  Result Value Ref Range Status   Specimen Description BLOOD PICC LINE  Final   Special Requests BOTTLES DRAWN AEROBIC AND ANAEROBIC 10 CC  Final   Culture   Final    NO GROWTH 5 DAYS Performed at [facility]    Report Status 10/23/2014 FINAL  Final  Culture, expectorated sputum-assessment     Status: None  Collection Time: 10/17/14  6:15 AM  Result Value Ref Range Status   Specimen Description SPUTUM  Final   Special Requests NONE  Final   Sputum evaluation   Final    THIS SPECIMEN IS ACCEPTABLE. RESPIRATORY CULTURE REPORT TO FOLLOW.   Report Status 10/17/2014 FINAL  Final  Culture, respiratory (NON-Expectorated)     Status: None   Collection Time: 10/17/14  8:00 AM  Result Value Ref Range Status   Specimen Description SPUTUM  Final   Special Requests NONE  Final   Gram Stain   Final    NO WBC SEEN NO SQUAMOUS EPITHELIAL CELLS SEEN RARE GRAM POSITIVE COCCI IN PAIRS [facility]    Culture   Final    NORMAL OROPHARYNGEAL FLORA Performed at [facility]    Report Status 10/19/2014 FINAL  Final  Body fluid culture     Status: None   Collection Time: 10/18/14 10:07 AM  Result Value Ref Range Status   Specimen Description PLEURAL LEFT FLUID  Final   Special Requests NONE  Final   Gram Stain   Final    NO WBC SEEN NO ORGANISMS SEEN [facility]    Culture   Final    NO GROWTH 3 DAYS Performed at [facility]     Report Status 10/22/2014 FINAL  Final  Culture, body fluid-bottle     Status: None   Collection Time: 10/23/14  9:39 AM  Result Value Ref Range Status   Specimen Description FLUID BILE  Final   Special Requests NONE  Final   Culture NO GROWTH 5 DAYS  Final   Report Status 10/28/2014 FINAL  Final  Gram stain     Status: None   Collection Time: 10/23/14  9:39 AM  Result Value Ref Range Status   Specimen Description FLUID BILE  Final   Special Requests NONE  Final   Gram Stain NO WBC SEEN NO ORGANISMS SEEN   Final   Report Status 10/23/2014 FINAL  Final  Clostridium Difficile by PCR (not [facility])     Status: None   Collection Time: 10/29/14  4:59 AM  Result Value Ref Range Status   C difficile by pcr NEGATIVE NEGATIVE Final  Culture, respiratory (NON-Expectorated)     Status: None   Collection Time: 11/07/14  6:01 PM  Result Value Ref Range Status   Specimen Description TRACHEAL ASPIRATE  Final   Special Requests NONE  Final   Gram Stain   Final    RARE WBC PRESENT,BOTH PMN AND MONONUCLEAR NO SQUAMOUS EPITHELIAL CELLS SEEN ABUNDANT GRAM NEGATIVE RODS Performed at [facility]    Culture   Final    MODERATE PSEUDOMONAS AERUGINOSA 7 Note: COLISTIN SENSITIVE TO 0.38 ug/ml ETEST results for this drug are "FOR INVESTIGATIONAL USE ONLY" and should NOT be used for clinical purposes. MULTIDRUG RESISTANT CRITICAL RESULT CALLED TO, READ BACK BY AND VERIFIED WITH: HEATHER HUNTER 7 16@1154  MODERATE ESCHERICHIA COLI Performed at [facility]    Report Status 11/12/2014 FINAL  Final   Organism ID, Bacteria ESCHERICHIA COLI  Final   Organism ID, Bacteria PSEUDOMONAS AERUGINOSA  Final   Organism ID, Bacteria ESCHERICHIA COLI  Final      Susceptibility   Escherichia coli - MIC*    AMPICILLIN >=32 RESISTANT Resistant     AMPICILLIN/SULBACTAM >=32 RESISTANT Resistant     CEFAZOLIN 32 RESISTANT Resistant     CEFEPIME <=1 SENSITIVE Sensitive  CEFTAZIDIME <=1 SENSITIVE  Sensitive     CEFTRIAXONE <=1 SENSITIVE Sensitive     CIPROFLOXACIN >=4 RESISTANT Resistant     GENTAMICIN <=1 SENSITIVE Sensitive     IMIPENEM 0.5 SENSITIVE Sensitive     PIP/TAZO >=128 RESISTANT Resistant     TOBRAMYCIN <=1 SENSITIVE Sensitive     TRIMETH/SULFA >=320 RESISTANT Resistant    Escherichia coli - MIC*    AMPICILLIN >=32 RESISTANT Resistant     AMPICILLIN/SULBACTAM >=32 RESISTANT Resistant     CEFAZOLIN 32 RESISTANT Resistant     CEFEPIME <=1 SENSITIVE Sensitive     CEFTAZIDIME <=1 SENSITIVE Sensitive     CEFTRIAXONE <=1 SENSITIVE Sensitive     CIPROFLOXACIN >=4 RESISTANT Resistant     GENTAMICIN <=1 SENSITIVE Sensitive     IMIPENEM 0.5 SENSITIVE Sensitive     PIP/TAZO >=128 RESISTANT Resistant     TOBRAMYCIN <=1 SENSITIVE Sensitive     TRIMETH/SULFA >=320 RESISTANT Resistant     * MODERATE ESCHERICHIA COLI    MODERATE ESCHERICHIA COLI   Pseudomonas aeruginosa - MIC*    CEFEPIME >=64 RESISTANT Resistant     CEFTAZIDIME 32 RESISTANT Resistant     CIPROFLOXACIN >=4 RESISTANT Resistant     GENTAMICIN >=16 RESISTANT Resistant     IMIPENEM >=16 RESISTANT Resistant     PIP/TAZO >=128 RESISTANT Resistant     TOBRAMYCIN >=16 RESISTANT Resistant     * MODERATE PSEUDOMONAS AERUGINOSA    Coagulation Studies: No results for input(s): LABPROT, INR in the last 72 hours.  Urinalysis: No results for input(s): COLORURINE, LABSPEC, PHURINE, GLUCOSEU, HGBUR, BILIRUBINUR, KETONESUR, PROTEINUR, UROBILINOGEN, NITRITE, LEUKOCYTESUR in the last 72 hours.  Invalid input(s): APPERANCEUR    Imaging: No results found.   Medications:   aranesp 2000 q WED Renvela 2.4 gm AC    Assessment/ Plan:  54 y.o. male  with  ESRD, HTN, CHF, Previous alcohol abuse, tobacco abse, , was admitted to [facility] from [facility] on 10/14/2014 with resp failure. He is s/p Ex Lap, sigmoid colectomy and colostomy placement. He has a wound vac. Regular dialysis at [facility] center in [place],  [place]  1. ESRD: underwent HD today BFR 400/DFR 800. UF 1000 cc Next HD on FRIDAY  2. AOCKD: hgb relatively stable at 9.2, continue high dose aranesp.  3. SHPTH:  Phos 7.3-5.7, improved Continue renvela    Berlynn Warsame 7/20/20162:42 PM

## 2014-11-27 LAB — CBC
HEMATOCRIT: 29.6 % — AB (ref 39.0–52.0)
Hemoglobin: 9.3 g/dL — ABNORMAL LOW (ref 13.0–17.0)
MCH: 27.4 pg (ref 26.0–34.0)
MCHC: 31.4 g/dL (ref 30.0–36.0)
MCV: 87.3 fL (ref 78.0–100.0)
Platelets: 307 10*3/uL (ref 150–400)
RBC: 3.39 MIL/uL — ABNORMAL LOW (ref 4.22–5.81)
RDW: 18.2 % — ABNORMAL HIGH (ref 11.5–15.5)
WBC: 5.2 10*3/uL (ref 4.0–10.5)

## 2014-11-27 LAB — RENAL FUNCTION PANEL
ANION GAP: 13 (ref 5–15)
Albumin: 2.8 g/dL — ABNORMAL LOW (ref 3.5–5.0)
BUN: 49 mg/dL — AB (ref 6–20)
CALCIUM: 10.4 mg/dL — AB (ref 8.9–10.3)
CHLORIDE: 96 mmol/L — AB (ref 101–111)
CO2: 25 mmol/L (ref 22–32)
CREATININE: 7.68 mg/dL — AB (ref 0.61–1.24)
GFR calc Af Amer: 8 mL/min — ABNORMAL LOW (ref >60)
GFR calc non Af Amer: 7 mL/min — ABNORMAL LOW (ref >60)
GLUCOSE: 90 mg/dL (ref 65–99)
Phosphorus: 6.4 mg/dL — ABNORMAL HIGH (ref 2.5–4.6)
Potassium: 4.8 mmol/L (ref 3.5–5.1)
Sodium: 134 mmol/L — ABNORMAL LOW (ref 135–145)

## 2014-11-27 NOTE — Progress Notes (Signed)
Subjective:  Patient seen during dialysis goal of UF 1000 cc  Laying in bed, watching TV Blood flow rate 400, DFR 800  Objective:  Vital signs in last 24 hours:  120/75 99  98.0     Physical Exam: General: NAD  HEENT anicteric  Neck Trach in place, + secretions   Pulm/lungs Clear bilateral, normal effort  CVS/Heart S1S2 irregular   Abdomen:  Soft NT, colostomy  Extremities: No dependent edema  Neurologic: Awake, alert, follows simple commands  Skin: No acute rashes  Access: Left radiocephalic AVF       Basic Metabolic Panel:  Recent Labs Lab 11/23/14 0515 11/24/14 0517 11/25/14 0658 11/27/14 0515  NA 133* 134* 131* 134*  K 5.3* 5.3* 5.0 4.8  CL 95* 95* 91* 96*  CO2 23 28 25 25   GLUCOSE 82 85 88 90  BUN 69* 42* 62* 49*  CREATININE 8.70* 5.91* 7.49* 7.68*  CALCIUM 10.4* 10.4* 10.2 10.4*  MG 2.4  --   --   --   PHOS 7.3* 4.6 5.7* 6.4*     CBC:  Recent Labs Lab 11/23/14 0515 11/24/14 0527 11/25/14 0658 11/27/14 0515  WBC 6.3 6.5 5.7 5.2  NEUTROABS 2.7 2.2  --   --   HGB 9.5* 9.6* 9.2* 9.3*  HCT 30.5* 30.9* 29.5* 29.6*  MCV 86.2 87.5 86.5 87.3  PLT 344 281 318 307      Microbiology: Results for orders placed or performed during the hospital encounter of 10/14/14  Culture, blood (routine x 2)     Status: None   Collection Time: 10/16/14  9:16 AM  Result Value Ref Range Status   Specimen Description BLOOD PICC LINE  Final   Special Requests BOTTLES DRAWN AEROBIC AND ANAEROBIC  Final   Culture   Final    NO GROWTH 5 DAYS Performed at [facility]    Report Status 10/22/2014 FINAL  Final  Culture, blood (routine x 2)     Status: None   Collection Time: 10/16/14 10:25 AM  Result Value Ref Range Status   Specimen Description BLOOD RIGHT HAND  Final   Special Requests BOTTLES DRAWN AEROBIC ONLY 10CC  Final   Culture   Final    NO GROWTH 5 DAYS Performed at [facility]    Report Status 10/22/2014 FINAL  Final  Culture,  blood (routine x 2)     Status: None   Collection Time: 10/16/14 10:53 PM  Result Value Ref Range Status   Specimen Description BLOOD RIGHT ANTECUBITAL  Final   Special Requests BOTTLES DRAWN AEROBIC AND ANAEROBIC 10CC  Final   Culture   Final    NO GROWTH 5 DAYS Performed at [facility]    Report Status 10/23/2014 FINAL  Final  Culture, blood (routine x 2)     Status: None   Collection Time: 10/16/14 11:00 PM  Result Value Ref Range Status   Specimen Description BLOOD PICC LINE  Final   Special Requests BOTTLES DRAWN AEROBIC AND ANAEROBIC 10 CC  Final   Culture   Final    NO GROWTH 5 DAYS Performed at [facility]    Report Status 10/23/2014 FINAL  Final  Culture, expectorated sputum-assessment     Status: None   Collection Time: 10/17/14  6:15 AM  Result Value Ref Range Status   Specimen Description SPUTUM  Final   Special Requests NONE  Final   Sputum evaluation   Final    THIS  SPECIMEN IS ACCEPTABLE. RESPIRATORY CULTURE REPORT TO FOLLOW.   Report Status 10/17/2014 FINAL  Final  Culture, respiratory (NON-Expectorated)     Status: None   Collection Time: 10/17/14  8:00 AM  Result Value Ref Range Status   Specimen Description SPUTUM  Final   Special Requests NONE  Final   Gram Stain   Final    NO WBC SEEN NO SQUAMOUS EPITHELIAL CELLS SEEN RARE GRAM POSITIVE COCCI IN PAIRS Performed at [facility]    Culture   Final    NORMAL OROPHARYNGEAL FLORA Performed at [facility]    Report Status 10/19/2014 FINAL  Final  Body fluid culture     Status: None   Collection Time: 10/18/14 10:07 AM  Result Value Ref Range Status   Specimen Description PLEURAL LEFT FLUID  Final   Special Requests NONE  Final   Gram Stain   Final    NO WBC SEEN NO ORGANISMS SEEN Performed at [facility]    Culture   Final    NO GROWTH 3 DAYS Performed at [facility]    Report Status 10/22/2014 FINAL  Final  Culture, body fluid-bottle      Status: None   Collection Time: 10/23/14  9:39 AM  Result Value Ref Range Status   Specimen Description FLUID BILE  Final   Special Requests NONE  Final   Culture NO GROWTH 5 DAYS  Final   Report Status 10/28/2014 FINAL  Final  Gram stain     Status: None   Collection Time: 10/23/14  9:39 AM  Result Value Ref Range Status   Specimen Description FLUID BILE  Final   Special Requests NONE  Final   Gram Stain NO WBC SEEN NO ORGANISMS SEEN   Final   Report Status 10/23/2014 FINAL  Final  Clostridium Difficile by PCR (not at [facility])     Status: None   Collection Time: 10/29/14  4:59 AM  Result Value Ref Range Status   C difficile by pcr NEGATIVE NEGATIVE Final  Culture, respiratory (NON-Expectorated)     Status: None   Collection Time: 11/07/14  6:01 PM  Result Value Ref Range Status   Specimen Description TRACHEAL ASPIRATE  Final   Special Requests NONE  Final   Gram Stain   Final    RARE WBC PRESENT,BOTH PMN AND MONONUCLEAR NO SQUAMOUS EPITHELIAL CELLS SEEN ABUNDANT GRAM NEGATIVE RODS Performed at [facility]    Culture   Final    MODERATE PSEUDOMONAS AERUGINOSA 7 Note: COLISTIN SENSITIVE TO 0.38 ug/ml ETEST results for this drug are "FOR INVESTIGATIONAL USE ONLY" and should NOT be used for clinical purposes. MULTIDRUG RESISTANT CRITICAL RESULT CALLED TO, READ BACK BY AND VERIFIED WITH: HEATHER HUNTER 7 16@1154  MODERATE ESCHERICHIA COLI Performed at [facility]    Report Status 11/12/2014 FINAL  Final   Organism ID, Bacteria ESCHERICHIA COLI  Final   Organism ID, Bacteria PSEUDOMONAS AERUGINOSA  Final   Organism ID, Bacteria ESCHERICHIA COLI  Final      Susceptibility   Escherichia coli - MIC*    AMPICILLIN >=32 RESISTANT Resistant     AMPICILLIN/SULBACTAM >=32 RESISTANT Resistant     CEFAZOLIN 32 RESISTANT Resistant     CEFEPIME <=1 SENSITIVE Sensitive     CEFTAZIDIME <=1 SENSITIVE Sensitive     CEFTRIAXONE <=1 SENSITIVE Sensitive     CIPROFLOXACIN >=4  RESISTANT Resistant     GENTAMICIN <=1 SENSITIVE Sensitive     IMIPENEM 0.5  SENSITIVE Sensitive     PIP/TAZO >=128 RESISTANT Resistant     TOBRAMYCIN <=1 SENSITIVE Sensitive     TRIMETH/SULFA >=320 RESISTANT Resistant    Escherichia coli - MIC*    AMPICILLIN >=32 RESISTANT Resistant     AMPICILLIN/SULBACTAM >=32 RESISTANT Resistant     CEFAZOLIN 32 RESISTANT Resistant     CEFEPIME <=1 SENSITIVE Sensitive     CEFTAZIDIME <=1 SENSITIVE Sensitive     CEFTRIAXONE <=1 SENSITIVE Sensitive     CIPROFLOXACIN >=4 RESISTANT Resistant     GENTAMICIN <=1 SENSITIVE Sensitive     IMIPENEM 0.5 SENSITIVE Sensitive     PIP/TAZO >=128 RESISTANT Resistant     TOBRAMYCIN <=1 SENSITIVE Sensitive     TRIMETH/SULFA >=320 RESISTANT Resistant     * MODERATE ESCHERICHIA COLI    MODERATE ESCHERICHIA COLI   Pseudomonas aeruginosa - MIC*    CEFEPIME >=64 RESISTANT Resistant     CEFTAZIDIME 32 RESISTANT Resistant     CIPROFLOXACIN >=4 RESISTANT Resistant     GENTAMICIN >=16 RESISTANT Resistant     IMIPENEM >=16 RESISTANT Resistant     PIP/TAZO >=128 RESISTANT Resistant     TOBRAMYCIN >=16 RESISTANT Resistant     * MODERATE PSEUDOMONAS AERUGINOSA    Coagulation Studies: No results for input(s): LABPROT, INR in the last 72 hours.  Urinalysis: No results for input(s): COLORURINE, LABSPEC, PHURINE, GLUCOSEU, HGBUR, BILIRUBINUR, KETONESUR, PROTEINUR, UROBILINOGEN, NITRITE, LEUKOCYTESUR in the last 72 hours.  Invalid input(s): APPERANCEUR    Imaging: No results found.   Medications:   aranesp 2000 q WED Renvela 2.4 gm AC    Assessment/ Plan:  54 y.o. male  with  ESRD, HTN, CHF, Previous alcohol abuse, tobacco abse, , was admitted to [facility] from [facility] on 10/14/2014 with resp failure. He is s/p Ex Lap, sigmoid colectomy and colostomy placement. He has a wound vac. Regular dialysis at [facility] center in [place], [place]  1. ESRD:  Patient seen during dialysis Tolerating well  BFR  400/DFR 800. UF 1000 cc Next HD on monday  2. AOCKD: hgb relatively stable at 9.3, continue high dose aranesp.  3. SHPTH:  Phos 7.3->5.7->6.4, Continue renvela    Harold Mayo 7/22/201610:47 AM

## 2014-11-30 LAB — COMPREHENSIVE METABOLIC PANEL
ALBUMIN: 2.8 g/dL — AB (ref 3.5–5.0)
ALT: 22 U/L (ref 17–63)
ANION GAP: 15 (ref 5–15)
AST: 31 U/L (ref 15–41)
Alkaline Phosphatase: 200 U/L — ABNORMAL HIGH (ref 38–126)
BILIRUBIN TOTAL: 1.7 mg/dL — AB (ref 0.3–1.2)
BUN: 81 mg/dL — ABNORMAL HIGH (ref 6–20)
CO2: 23 mmol/L (ref 22–32)
Calcium: 10.1 mg/dL (ref 8.9–10.3)
Chloride: 91 mmol/L — ABNORMAL LOW (ref 101–111)
Creatinine, Ser: 8.95 mg/dL — ABNORMAL HIGH (ref 0.61–1.24)
GFR calc Af Amer: 7 mL/min — ABNORMAL LOW (ref >60)
GFR calc non Af Amer: 6 mL/min — ABNORMAL LOW (ref >60)
Glucose, Bld: 93 mg/dL (ref 65–99)
Potassium: 4.8 mmol/L (ref 3.5–5.1)
Sodium: 129 mmol/L — ABNORMAL LOW (ref 135–145)
TOTAL PROTEIN: 8.7 g/dL — AB (ref 6.5–8.1)

## 2014-11-30 LAB — CBC
HEMATOCRIT: 31.7 % — AB (ref 39.0–52.0)
Hemoglobin: 9.9 g/dL — ABNORMAL LOW (ref 13.0–17.0)
MCH: 27 pg (ref 26.0–34.0)
MCHC: 31.2 g/dL (ref 30.0–36.0)
MCV: 86.4 fL (ref 78.0–100.0)
PLATELETS: 348 10*3/uL (ref 150–400)
RBC: 3.67 MIL/uL — ABNORMAL LOW (ref 4.22–5.81)
RDW: 17.9 % — AB (ref 11.5–15.5)
WBC: 6.7 10*3/uL (ref 4.0–10.5)

## 2014-11-30 LAB — TRIGLYCERIDES: Triglycerides: 98 mg/dL (ref <150)

## 2014-11-30 LAB — PHOSPHORUS: Phosphorus: 7.7 mg/dL — ABNORMAL HIGH (ref 2.5–4.6)

## 2014-11-30 LAB — MAGNESIUM: Magnesium: 2.4 mg/dL (ref 1.7–2.4)

## 2014-11-30 NOTE — Progress Notes (Signed)
Subjective:  Pt seen post dialysis. UF achieved was 1kg.  In good spirits. Remarkable recovery, his condition greatly improved as compared to one month ago.  Objective:  Vital signs in last 24 hours:  98.5 93 24 139/65    Physical Exam: General: NAD  HEENT anicteric  Neck Trach now decannulated   Pulm/lungs Clear bilateral, normal effort  CVS/Heart S1S2 irregular   Abdomen:  Soft NT, colostomy  Extremities: No LE edema  Neurologic: Awake, alert, follows simple commands  Skin: No acute rashes  Access: Left radiocephalic AVF       Basic Metabolic Panel:  Recent Labs Lab 11/24/14 0517 11/25/14 0658 11/27/14 0515 11/30/14 0500  NA 134* 131* 134* 129*  K 5.3* 5.0 4.8 4.8  CL 95* 91* 96* 91*  CO2 28 25 25 23   GLUCOSE 85 88 90 93  BUN 42* 62* 49* 81*  CREATININE 5.91* 7.49* 7.68* 8.95*  CALCIUM 10.4* 10.2 10.4* 10.1  MG  --   --   --  2.4  PHOS 4.6 5.7* 6.4* 7.7*     CBC:  Recent Labs Lab 11/24/14 0527 11/25/14 0658 11/27/14 0515 11/30/14 0500  WBC 6.5 5.7 5.2 6.7  NEUTROABS 2.2  --   --   --   HGB 9.6* 9.2* 9.3* 9.9*  HCT 30.9* 29.5* 29.6* 31.7*  MCV 87.5 86.5 87.3 86.4  PLT 281 318 307 348      Microbiology: Results for orders placed or performed during the hospital encounter of 10/14/14  Culture, blood (routine x 2)     Status: None   Collection Time: 10/16/14  9:16 AM  Result Value Ref Range Status   Specimen Description BLOOD PICC LINE  Final   Special Requests BOTTLES DRAWN AEROBIC AND ANAEROBIC  Final   Culture   Final    NO GROWTH 5 DAYS Performed at [facility]    Report Status 10/22/2014 FINAL  Final  Culture, blood (routine x 2)     Status: None   Collection Time: 10/16/14 10:25 AM  Result Value Ref Range Status   Specimen Description BLOOD RIGHT HAND  Final   Special Requests BOTTLES DRAWN AEROBIC ONLY 10CC  Final   Culture   Final    NO GROWTH 5 DAYS Performed at [facility]    Report Status  10/22/2014 FINAL  Final  Culture, blood (routine x 2)     Status: None   Collection Time: 10/16/14 10:53 PM  Result Value Ref Range Status   Specimen Description BLOOD RIGHT ANTECUBITAL  Final   Special Requests BOTTLES DRAWN AEROBIC AND ANAEROBIC 10CC  Final   Culture   Final    NO GROWTH 5 DAYS Performed at [facility]    Report Status 10/23/2014 FINAL  Final  Culture, blood (routine x 2)     Status: None   Collection Time: 10/16/14 11:00 PM  Result Value Ref Range Status   Specimen Description BLOOD PICC LINE  Final   Special Requests BOTTLES DRAWN AEROBIC AND ANAEROBIC 10 CC  Final   Culture   Final    NO GROWTH 5 DAYS Performed at [facility]    Report Status 10/23/2014 FINAL  Final  Culture, expectorated sputum-assessment     Status: None   Collection Time: 10/17/14  6:15 AM  Result Value Ref Range Status   Specimen Description SPUTUM  Final   Special Requests NONE  Final   Sputum evaluation   Final  THIS SPECIMEN IS ACCEPTABLE. RESPIRATORY CULTURE REPORT TO FOLLOW.   Report Status 10/17/2014 FINAL  Final  Culture, respiratory (NON-Expectorated)     Status: None   Collection Time: 10/17/14  8:00 AM  Result Value Ref Range Status   Specimen Description SPUTUM  Final   Special Requests NONE  Final   Gram Stain   Final    NO WBC SEEN NO SQUAMOUS EPITHELIAL CELLS SEEN RARE GRAM POSITIVE COCCI IN PAIRS Performed at [facility]    Culture   Final    NORMAL OROPHARYNGEAL FLORA Performed at [facility]    Report Status 10/19/2014 FINAL  Final  Body fluid culture     Status: None   Collection Time: 10/18/14 10:07 AM  Result Value Ref Range Status   Specimen Description PLEURAL LEFT FLUID  Final   Special Requests NONE  Final   Gram Stain   Final    NO WBC SEEN NO ORGANISMS SEEN Performed at [facility]    Culture   Final    NO GROWTH 3 DAYS Performed at [facility]    Report Status 10/22/2014 FINAL  Final   Culture, body fluid-bottle     Status: None   Collection Time: 10/23/14  9:39 AM  Result Value Ref Range Status   Specimen Description FLUID BILE  Final   Special Requests NONE  Final   Culture NO GROWTH 5 DAYS  Final   Report Status 10/28/2014 FINAL  Final  Gram stain     Status: None   Collection Time: 10/23/14  9:39 AM  Result Value Ref Range Status   Specimen Description FLUID BILE  Final   Special Requests NONE  Final   Gram Stain NO WBC SEEN NO ORGANISMS SEEN   Final   Report Status 10/23/2014 FINAL  Final  Clostridium Difficile by PCR (not at [facility])     Status: None   Collection Time: 10/29/14  4:59 AM  Result Value Ref Range Status   C difficile by pcr NEGATIVE NEGATIVE Final  Culture, respiratory (NON-Expectorated)     Status: None   Collection Time: 11/07/14  6:01 PM  Result Value Ref Range Status   Specimen Description TRACHEAL ASPIRATE  Final   Special Requests NONE  Final   Gram Stain   Final    RARE WBC PRESENT,BOTH PMN AND MONONUCLEAR NO SQUAMOUS EPITHELIAL CELLS SEEN ABUNDANT GRAM NEGATIVE RODS Performed at [facility]    Culture   Final    MODERATE PSEUDOMONAS AERUGINOSA 7 Note: COLISTIN SENSITIVE TO 0.38 ug/ml ETEST results for this drug are "FOR INVESTIGATIONAL USE ONLY" and should NOT be used for clinical purposes. MULTIDRUG RESISTANT CRITICAL RESULT CALLED TO, READ BACK BY AND VERIFIED WITH: HEATHER HUNTER 7 16@1154  MODERATE ESCHERICHIA COLI Performed at [facility]    Report Status 11/12/2014 FINAL  Final   Organism ID, Bacteria ESCHERICHIA COLI  Final   Organism ID, Bacteria PSEUDOMONAS AERUGINOSA  Final   Organism ID, Bacteria ESCHERICHIA COLI  Final      Susceptibility   Escherichia coli - MIC*    AMPICILLIN >=32 RESISTANT Resistant     AMPICILLIN/SULBACTAM >=32 RESISTANT Resistant     CEFAZOLIN 32 RESISTANT Resistant     CEFEPIME <=1 SENSITIVE Sensitive     CEFTAZIDIME <=1 SENSITIVE Sensitive     CEFTRIAXONE <=1 SENSITIVE  Sensitive     CIPROFLOXACIN >=4 RESISTANT Resistant     GENTAMICIN <=1 SENSITIVE Sensitive     IMIPENEM  0.5 SENSITIVE Sensitive     PIP/TAZO >=128 RESISTANT Resistant     TOBRAMYCIN <=1 SENSITIVE Sensitive     TRIMETH/SULFA >=320 RESISTANT Resistant    Escherichia coli - MIC*    AMPICILLIN >=32 RESISTANT Resistant     AMPICILLIN/SULBACTAM >=32 RESISTANT Resistant     CEFAZOLIN 32 RESISTANT Resistant     CEFEPIME <=1 SENSITIVE Sensitive     CEFTAZIDIME <=1 SENSITIVE Sensitive     CEFTRIAXONE <=1 SENSITIVE Sensitive     CIPROFLOXACIN >=4 RESISTANT Resistant     GENTAMICIN <=1 SENSITIVE Sensitive     IMIPENEM 0.5 SENSITIVE Sensitive     PIP/TAZO >=128 RESISTANT Resistant     TOBRAMYCIN <=1 SENSITIVE Sensitive     TRIMETH/SULFA >=320 RESISTANT Resistant     * MODERATE ESCHERICHIA COLI    MODERATE ESCHERICHIA COLI   Pseudomonas aeruginosa - MIC*    CEFEPIME >=64 RESISTANT Resistant     CEFTAZIDIME 32 RESISTANT Resistant     CIPROFLOXACIN >=4 RESISTANT Resistant     GENTAMICIN >=16 RESISTANT Resistant     IMIPENEM >=16 RESISTANT Resistant     PIP/TAZO >=128 RESISTANT Resistant     TOBRAMYCIN >=16 RESISTANT Resistant     * MODERATE PSEUDOMONAS AERUGINOSA    Coagulation Studies: No results for input(s): LABPROT, INR in the last 72 hours.  Urinalysis: No results for input(s): COLORURINE, LABSPEC, PHURINE, GLUCOSEU, HGBUR, BILIRUBINUR, KETONESUR, PROTEINUR, UROBILINOGEN, NITRITE, LEUKOCYTESUR in the last 72 hours.  Invalid input(s): APPERANCEUR    Imaging: No results found.   Medications:  aranesp 200 q WED Renvela 2.4 gm AC   Assessment/ Plan:  54 y.o. male  with  ESRD, HTN, CHF, Previous alcohol abuse, tobacco abse,  was admitted to [facility] from [facility] on 10/14/2014 with resp failure. He is s/p Ex Lap, sigmoid colectomy and colostomy placement. He has a wound vac. Regular dialysis at [facility] center in [place], [place]  1. ESRD:  Pt seen post  dialysis. Achieved 1kg of UF. Will plan for HD again on Wednesday with target UF of 1.5kg.  2. AOCKD: hgb up to 9.9 now, continue aranesp [place] weekly.  3. SHPTH: Phos high at 7.7, expected to be high after weekend, pt eating better also.  Repeat phos on Wednesday, if still high may need to add phoslo as well.      Mirelle Biskup 7/25/20163:54 PM

## 2014-12-01 DIAGNOSIS — R4182 Altered mental status, unspecified: Secondary | ICD-10-CM | POA: Insufficient documentation

## 2014-12-01 DIAGNOSIS — R140 Abdominal distension (gaseous): Secondary | ICD-10-CM

## 2014-12-01 DIAGNOSIS — R400 Somnolence: Secondary | ICD-10-CM

## 2014-12-01 LAB — BASIC METABOLIC PANEL
Anion gap: 13 (ref 5–15)
BUN: 50 mg/dL — ABNORMAL HIGH (ref 6–20)
CALCIUM: 10.1 mg/dL (ref 8.9–10.3)
CO2: 26 mmol/L (ref 22–32)
Chloride: 98 mmol/L — ABNORMAL LOW (ref 101–111)
Creatinine, Ser: 6.45 mg/dL — ABNORMAL HIGH (ref 0.61–1.24)
GFR calc Af Amer: 10 mL/min — ABNORMAL LOW (ref >60)
GFR calc non Af Amer: 9 mL/min — ABNORMAL LOW (ref >60)
Glucose, Bld: 87 mg/dL (ref 65–99)
Potassium: 4.6 mmol/L (ref 3.5–5.1)
SODIUM: 137 mmol/L (ref 135–145)

## 2014-12-01 NOTE — Progress Notes (Signed)
   Name: Harold Mayo MRN: [id number] DOB: 07-Nov-1960    ADMISSION DATE:  10/14/2014 CONSULTATION DATE: 10/15/2014  REFERRING MD :  [facility]  CHIEF COMPLAINT:  Resp failure  BRIEF PATIENT DESCRIPTION:  54 yo male admitted to [facility] 09/10/14 with abdominal pain.  Had laparotomy, colon resection and abscess drainage.  Developed respiratory failure with failure to wean from vent and required tracheostomy 6/01.  Transferred to [facility] 6/08 for wound care, rehab, and vent weaning.  SUBJECTIVE:  No longer requiring MV Trach capped but thick secretions reported  VITAL SIGNS: 97.4 79 26 128.64, on RA 97%  PHYSICAL EXAMINATION: General: comfortable no distress Neuro:   follows commands well HEENT: old stoma clean closed Cardiovascular: HSR RRR Lungs: CTA Abdomen:  Open wound with mid line vac, colostomy Musculoskeletal:  intact Skin: warm and dry , no edema   CMP Latest Ref Rng 12/01/2014 11/30/2014 11/27/2014  Glucose 65 - 99 mg/dL 87 93 90  BUN 6 - 20 mg/dL 40(J) 81(X) 91(Y)  Creatinine 0.61 - 1.24 mg/dL 7.82(N) 5.62(Z) 3.08(M)  Sodium 135 - 145 mmol/L 137 129(L) 134(L)  Potassium 3.5 - 5.1 mmol/L 4.6 4.8 4.8  Chloride 101 - 111 mmol/L 98(L) 91(L) 96(L)  CO2 22 - 32 mmol/L Calcium 8.9 - 10.3 mg/dL 57.8 46.9 10.4(H)  Total Protein 6.5 - 8.1 g/dL - 8.7(H) -  Total Bilirubin 0.3 - 1.2 mg/dL - 1.7(H) -  Alkaline Phos 38 - 126 U/L - 200(H) -  AST 15 - 41 U/L - 31 -  ALT 17 - 63 U/L - 22 -    CBC Latest Ref Rng 11/30/2014 11/27/2014 11/25/2014  WBC 4.0 - 10.5 K/uL 6.7 5.2 5.7  Hemoglobin 13.0 - 17.0 g/dL 6.2(X) 5.2(W) 4.1(L)  Hematocrit 39.0 - 52.0 % 31.7(L) 29.6(L) 29.5(L)  Platelets 150 - 400 K/uL 348 307 318    ABG    Component Value Date/Time   PHART 7.393 11/08/2014 0915   PCO2ART 46.0* 11/08/2014 0915   PO2ART 77.4* 11/08/2014 0915   HCO3 27.5* 11/08/2014 0915   TCO2 28.9 11/08/2014 0915   ACIDBASEDEF 0.6 10/16/2014 0500   O2SAT 94.5 11/08/2014 0915      No results found.   ASSESSMENT  Vent free - capped. Close to decannulation. Decision will likely depend on adequate secretion clearance, whether he needs a cholecystectomy in near future. I believe that I would progress him, even though he will need the surgery in the future.   -decannulated on 22nd He is no WOB issue He eats No secretions Would healing well He coughs well IS as able Ambulation  Will SO call if needed  Mcarthur Rossetti. Tyson Alias, MD, FACP Pgr: [phone] [facility]

## 2014-12-02 LAB — RENAL FUNCTION PANEL
Albumin: 2.8 g/dL — ABNORMAL LOW (ref 3.5–5.0)
Anion gap: 12 (ref 5–15)
BUN: 62 mg/dL — AB (ref 6–20)
CALCIUM: 9.9 mg/dL (ref 8.9–10.3)
CO2: 24 mmol/L (ref 22–32)
CREATININE: 7.1 mg/dL — AB (ref 0.61–1.24)
Chloride: 99 mmol/L — ABNORMAL LOW (ref 101–111)
GFR calc Af Amer: 9 mL/min — ABNORMAL LOW (ref >60)
GFR, EST NON AFRICAN AMERICAN: 8 mL/min — AB (ref >60)
Glucose, Bld: 84 mg/dL (ref 65–99)
POTASSIUM: 4.6 mmol/L (ref 3.5–5.1)
Phosphorus: 5.8 mg/dL — ABNORMAL HIGH (ref 2.5–4.6)
Sodium: 135 mmol/L (ref 135–145)

## 2014-12-02 LAB — CBC
HCT: 29.5 % — ABNORMAL LOW (ref 39.0–52.0)
Hemoglobin: 9.1 g/dL — ABNORMAL LOW (ref 13.0–17.0)
MCH: 27.1 pg (ref 26.0–34.0)
MCHC: 30.8 g/dL (ref 30.0–36.0)
MCV: 87.8 fL (ref 78.0–100.0)
Platelets: 290 10*3/uL (ref 150–400)
RBC: 3.36 MIL/uL — AB (ref 4.22–5.81)
RDW: 18 % — ABNORMAL HIGH (ref 11.5–15.5)
WBC: 6 10*3/uL (ref 4.0–10.5)

## 2017-05-18 IMAGING — CT CT ABD-PELV W/ CM
2 of 5 series · 3 of 46 positions shown, 4 images · IV contrast (Iodine)
Comparison: None.

CLINICAL DATA: Abdominal distention.

EXAM:
CT ABDOMEN AND PELVIS WITH CONTRAST
TECHNIQUE: Multidetector CT imaging of the abdomen and pelvis was performed
using the standard protocol following bolus administration of
intravenous contrast.
CONTRAST:  100mL OMNIPAQUE IOHEXOL 300 MG/ML  SOLN

[Series 204: cor · coronal · 0.45mm/px · 2 of 158 slices shown, 3 images]
[im 53/158  soft-tissue]
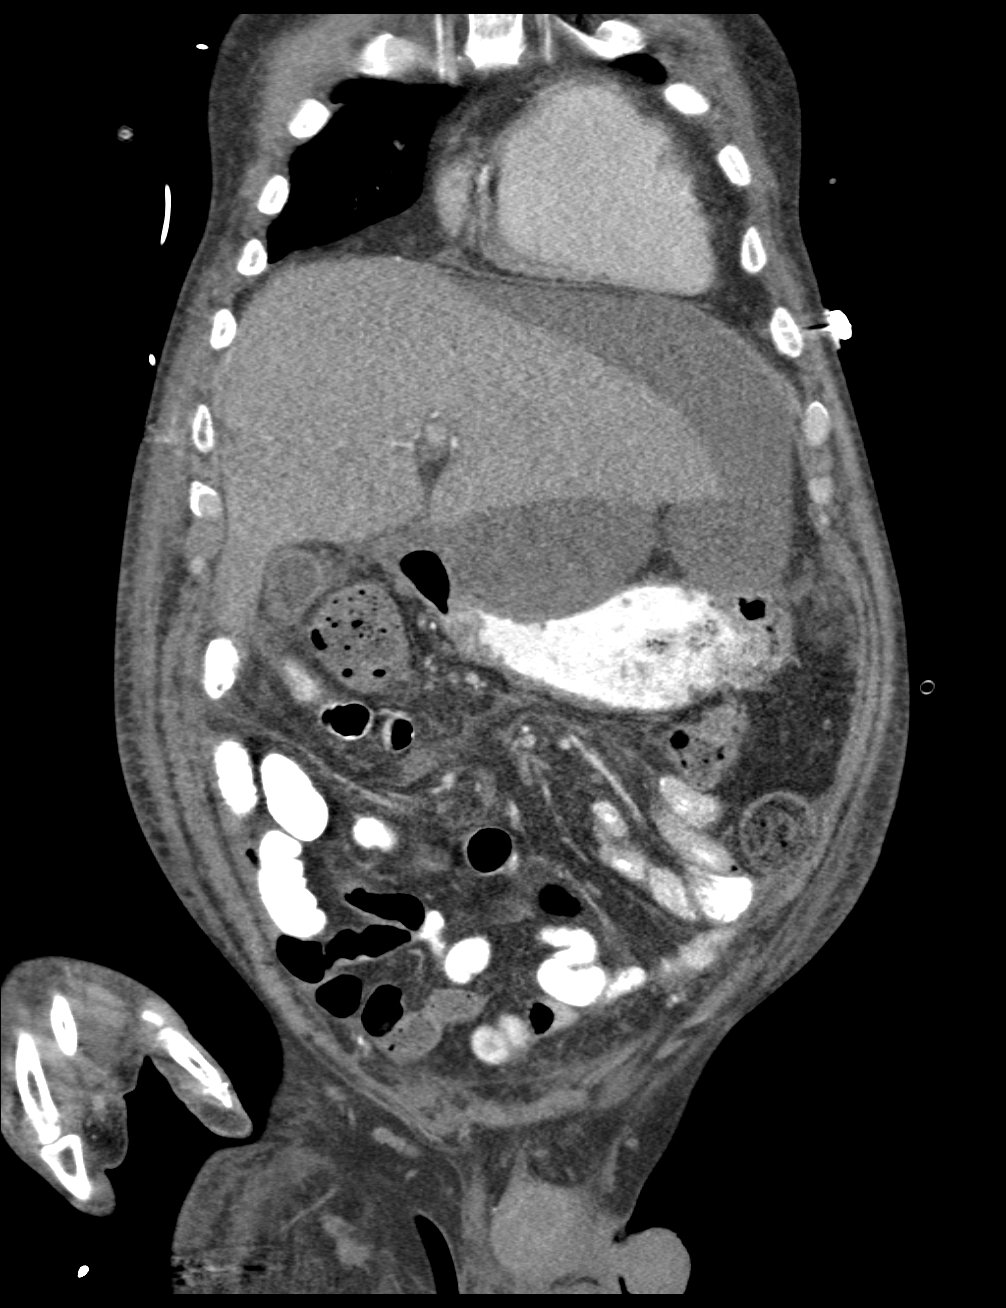
[im 53/158  bone]
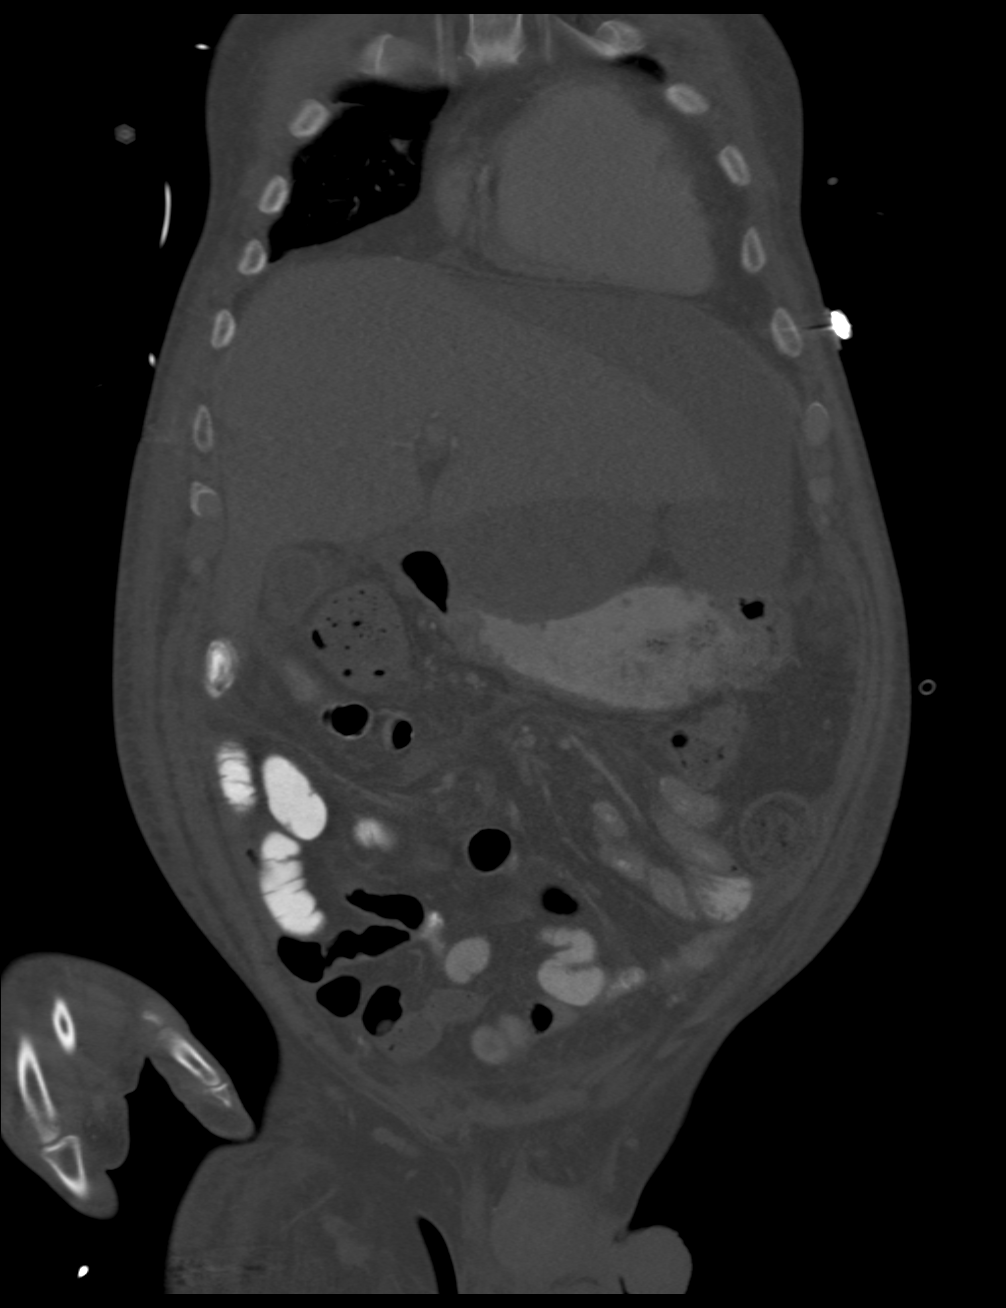
[im 123/158  soft-tissue]
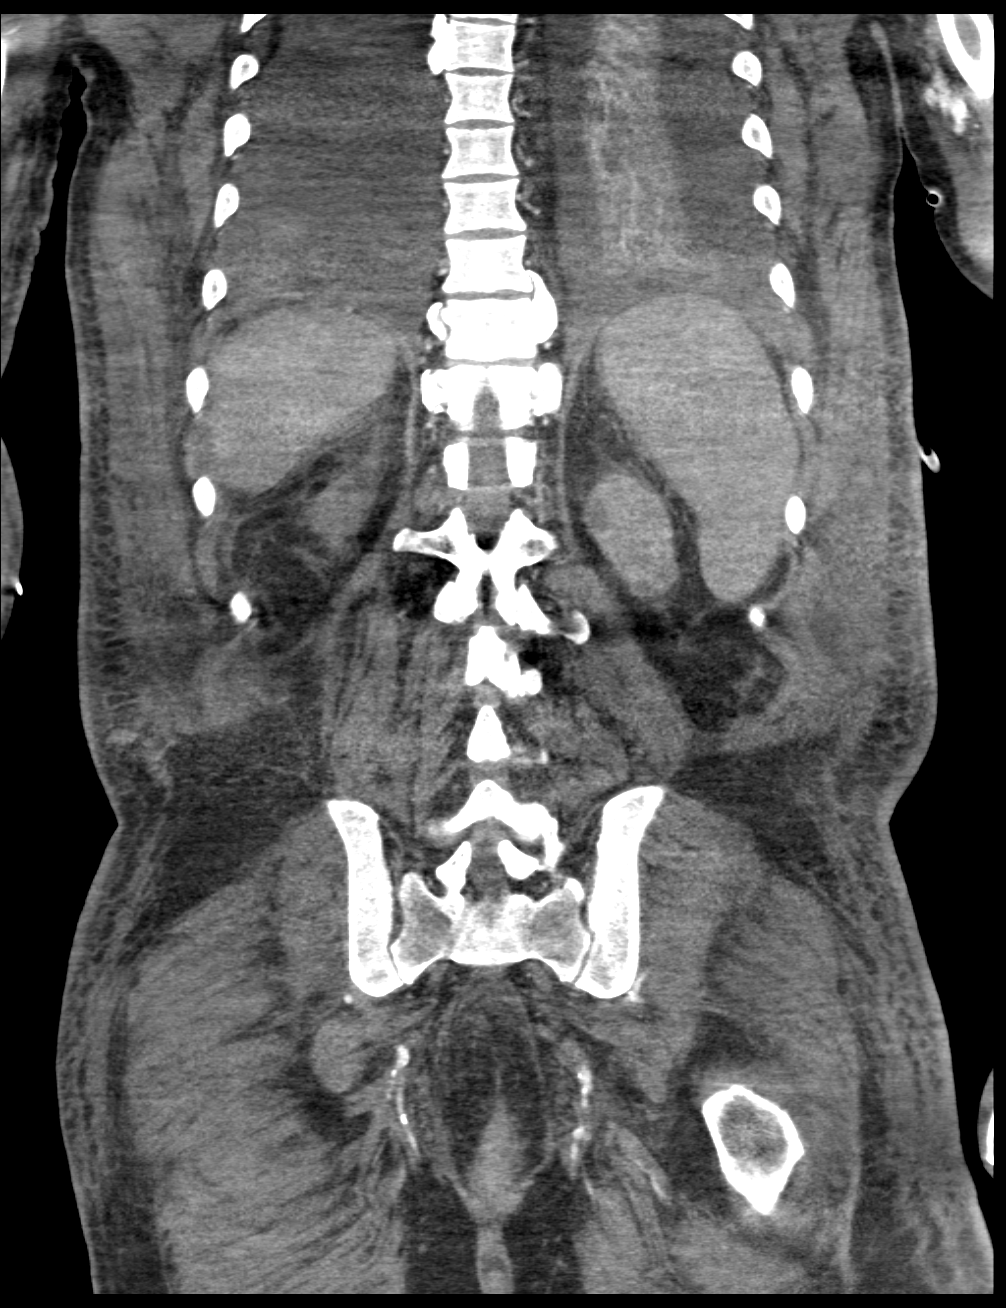

[Series 205: sag · sagittal · 0.45mm/px · 1 of 209 slices shown]
[im 70/209  soft-tissue]
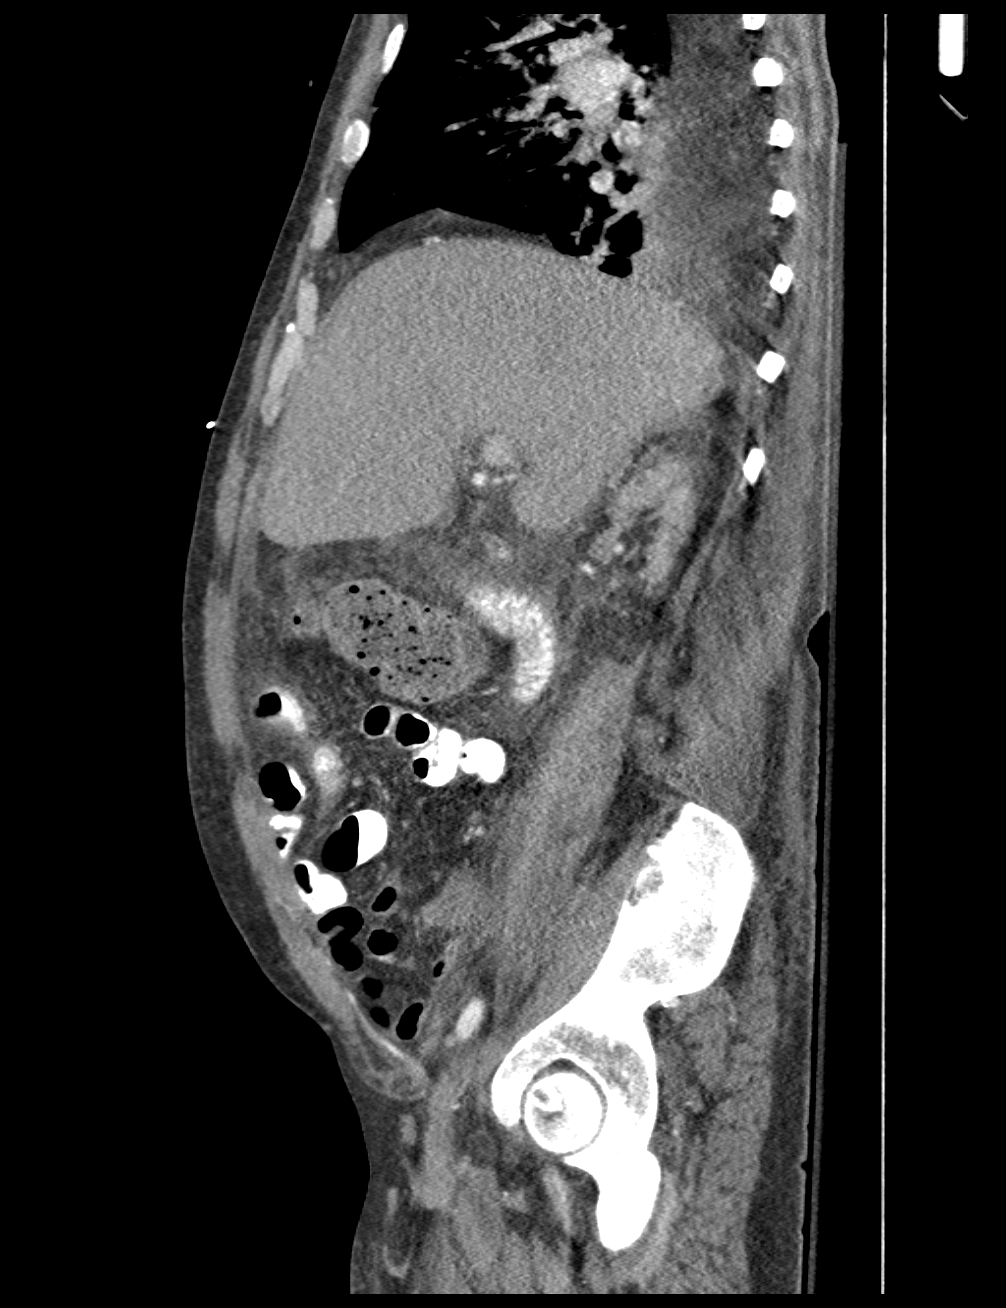

[3 of 46 positions shown; findings below may reference images not displayed]

FINDINGS: Lower chest: There are moderate bilateral pleural effusions.
Compressive atelectasis in the lower lobes bilaterally. There is
cardiomegaly.

Hepatobiliary: There is enlargement of the left hepatic lobe and
subtle nodular appearance to the contours suggesting cirrhosis.
Gallstone noted within the gallbladder neck. Gallbladder is mildly
contracted. Gallbladder wall appears mildly thickened which may be
related to liver disease.

Pancreas: No focal abnormality or ductal dilatation.

Spleen: No focal abnormality.  Normal size.

Adrenals/Urinary Tract: Adrenal glands unremarkable. Kidneys are
atrophic with multiple bilateral renal cysts. No hydronephrosis.
Urinary bladder decompressed.

Stomach/Bowel: Left lower quadrant ostomy noted. Stomach and small
bowel grossly unremarkable.

Vascular/Lymphatic: No retroperitoneal or mesenteric adenopathy.
Aorta normal caliber.

Reproductive: No mass or other significant abnormality.

Other: Fluid noted along the low left lobe of the liver. This
appears loculated along the left lobe of the liver

Musculoskeletal: No focal bone lesion or acute bony abnormality.
IMPRESSION: Moderate bilateral pleural effusions with compressive atelectasis in
the lower lobes.

Loculated fluid around the left lobe of the liver. This has mass
effect on the stomach.

Appearance of the liver suggest cirrhosis.

Gallstone noted within the gallbladder neck with mild apparent
gallbladder wall thickening which could be related to cholecystitis
or liver disease. Recommend clinical correlation.

## 2020-04-07 DEATH — deceased
